# Patient Record
Sex: Female | Born: 1990 | Race: White | Hispanic: No | Smoking: Former smoker
Health system: Southern US, Community
[De-identification: ages and names within clinical notes are randomized; demographics above are authoritative.]

## PROBLEM LIST (undated history)

## (undated) DIAGNOSIS — K5792 Diverticulitis of intestine, part unspecified, without perforation or abscess without bleeding: Secondary | ICD-10-CM

## (undated) DIAGNOSIS — J45909 Unspecified asthma, uncomplicated: Secondary | ICD-10-CM

## (undated) DIAGNOSIS — K5090 Crohn's disease, unspecified, without complications: Secondary | ICD-10-CM

## (undated) DIAGNOSIS — Z832 Family history of diseases of the blood and blood-forming organs and certain disorders involving the immune mechanism: Secondary | ICD-10-CM

## (undated) DIAGNOSIS — L409 Psoriasis, unspecified: Secondary | ICD-10-CM

## (undated) HISTORY — PX: COLONOSCOPY: SHX174

## (undated) HISTORY — PX: ESOPHAGOGASTRODUODENOSCOPY: SHX1529

## (undated) HISTORY — PX: TONSILLECTOMY: SUR1361

---
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
## 2011-03-24 ENCOUNTER — Ambulatory Visit: Payer: Self-pay | Admitting: Internal Medicine

## 2011-03-28 ENCOUNTER — Ambulatory Visit: Payer: Self-pay | Admitting: Internal Medicine

## 2011-04-20 ENCOUNTER — Ambulatory Visit: Payer: Self-pay

## 2011-05-02 ENCOUNTER — Ambulatory Visit: Payer: Self-pay | Admitting: Unknown Physician Specialty

## 2011-06-18 ENCOUNTER — Ambulatory Visit: Payer: Self-pay | Admitting: Unknown Physician Specialty

## 2011-06-21 LAB — PATHOLOGY REPORT

## 2011-12-22 IMAGING — NM NUCLEAR MEDICINE HEPATOHBILIARY INCLUDE GB
2 series · 13 of 13 positions shown · non-contrast
Comparison: none

REASON FOR EXAM: RUQ abd pain
COMMENTS:

[Series 1000: gallbladder statics · 4.80mm/px · 7 of 7 slices shown]
[im 1/7]
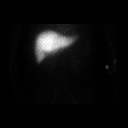
[im 2/7]
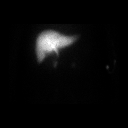
[im 3/7]
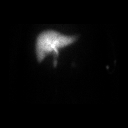
[im 4/7]
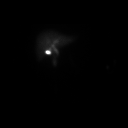
[im 5/7]
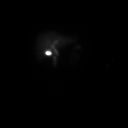
[im 6/7]
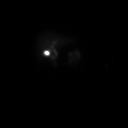
[im 7/7]
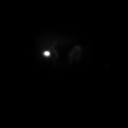

[Series 1000: gallbladder ef · 4.80mm/px · 6 of 30 frames shown]
[frame 3/30]
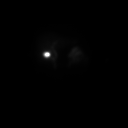
[frame 8/30]
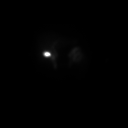
[frame 13/30]
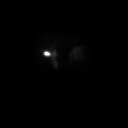
[frame 18/30]
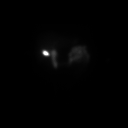
[frame 23/30]
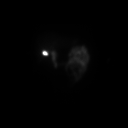
[frame 28/30]
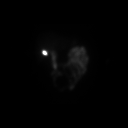

[13 of 13 positions shown; findings below may reference images not displayed]

PROCEDURE:     NM  - NM HEPATO WITH GB EJECT FRACTION  - May 02, 2011 [DATE]

RESULT:     Following intravenous administration of 8.37 mCi technetium 99m
Choletec, there is observed prompt visualization of tracer activity in the
liver at 10 minutes. At 40 minutes, tracer activity is visualized in the
gallbladder, common duct and proximal small bowel.

The gallbladder ejection fraction measures 73%.
IMPRESSION: 1.  Normal study.
2.  The gallbladder ejection fraction measures 73% which is in the normal
range.

## 2012-01-25 ENCOUNTER — Emergency Department: Payer: Self-pay | Admitting: *Deleted

## 2012-03-31 ENCOUNTER — Ambulatory Visit: Payer: Self-pay | Admitting: Emergency Medicine

## 2012-03-31 LAB — RAPID STREP-A WITH REFLX: Micro Text Report: NEGATIVE

## 2012-04-04 LAB — BETA STREP CULTURE(ARMC)

## 2012-11-17 ENCOUNTER — Emergency Department: Payer: Self-pay | Admitting: Emergency Medicine

## 2013-06-02 ENCOUNTER — Emergency Department (HOSPITAL_COMMUNITY)
Admission: EM | Admit: 2013-06-02 | Discharge: 2013-06-02 | Disposition: A | Discharge status: Home / Self Care | Payer: Managed Care, Other (non HMO) | Attending: Emergency Medicine | Admitting: Emergency Medicine

## 2013-06-02 ENCOUNTER — Encounter (HOSPITAL_COMMUNITY): Payer: Self-pay | Admitting: Emergency Medicine

## 2013-06-02 ENCOUNTER — Emergency Department (HOSPITAL_COMMUNITY): Payer: Managed Care, Other (non HMO)

## 2013-06-02 DIAGNOSIS — R42 Dizziness and giddiness: Secondary | ICD-10-CM | POA: Insufficient documentation

## 2013-06-02 DIAGNOSIS — Y9389 Activity, other specified: Secondary | ICD-10-CM | POA: Insufficient documentation

## 2013-06-02 DIAGNOSIS — Z79899 Other long term (current) drug therapy: Secondary | ICD-10-CM | POA: Insufficient documentation

## 2013-06-02 DIAGNOSIS — S0993XA Unspecified injury of face, initial encounter: Secondary | ICD-10-CM | POA: Insufficient documentation

## 2013-06-02 DIAGNOSIS — F17200 Nicotine dependence, unspecified, uncomplicated: Secondary | ICD-10-CM | POA: Insufficient documentation

## 2013-06-02 DIAGNOSIS — R209 Unspecified disturbances of skin sensation: Secondary | ICD-10-CM | POA: Insufficient documentation

## 2013-06-02 DIAGNOSIS — S0990XA Unspecified injury of head, initial encounter: Secondary | ICD-10-CM | POA: Insufficient documentation

## 2013-06-02 DIAGNOSIS — IMO0002 Reserved for concepts with insufficient information to code with codable children: Secondary | ICD-10-CM | POA: Insufficient documentation

## 2013-06-02 DIAGNOSIS — Y92410 Unspecified street and highway as the place of occurrence of the external cause: Secondary | ICD-10-CM | POA: Insufficient documentation
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
## 2013-06-02 NOTE — ED Notes (Signed)
I placed a medium C-collar on the patient.

## 2013-06-02 NOTE — ED Notes (Signed)
Pt discharged.Vital signs stable and GCS 15 
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
## 2013-06-03 NOTE — ED Provider Notes (Signed)
Medical screening examination/treatment/procedure(s) were performed by non-physician practitioner and as supervising physician I was immediately available for consultation/collaboration.  Fallan Mccarey R. Owain Eckerman, MD 06/03/13 0020 

## 2013-06-15 DIAGNOSIS — C4490 Unspecified malignant neoplasm of skin, unspecified: Secondary | ICD-10-CM | POA: Insufficient documentation

## 2013-07-13 ENCOUNTER — Ambulatory Visit: Payer: Self-pay | Admitting: Physician Assistant

## 2013-07-13 LAB — MONONUCLEOSIS SCREEN: Mono Test: NEGATIVE

## 2013-08-20 ENCOUNTER — Ambulatory Visit: Payer: Self-pay | Admitting: Otolaryngology

## 2015-03-17 ENCOUNTER — Encounter: Payer: Self-pay | Admitting: *Deleted

## 2015-03-17 ENCOUNTER — Other Ambulatory Visit: Payer: Managed Care, Other (non HMO)

## 2015-03-17 NOTE — Patient Instructions (Signed)
  Your procedure is scheduled on: 03-29-15 Report to [facility] To find out your arrival time please call [phone] between 1PM - 3PM on 03-28-15  Remember: Instructions that are not followed completely may result in serious medical risk, up to and including death, or upon the discretion of your surgeon and anesthesiologist your surgery may need to be rescheduled.    _X___ 1. Do not eat food or drink liquids after midnight. No gum chewing or hard candies.     _X___ 2. No Alcohol for 24 hours before or after surgery.   ____ 3. Bring all medications with you on the day of surgery if instructed.    ____ 4. Notify your doctor if there is any change in your medical condition     (cold, fever, infections).     Do not wear jewelry, make-up, hairpins, clips or nail polish.  Do not wear lotions, powders, or perfumes. You may wear deodorant.  Do not shave 48 hours prior to surgery. Men may shave face and neck.  Do not bring valuables to the hospital.    [facility] is not responsible for any belongings or valuables.               Contacts, dentures or bridgework may not be worn into surgery.  Leave your suitcase in the car. After surgery it may be brought to your room.  For patients admitted to the hospital, discharge time is determined by your treatment team.   Patients discharged the day of surgery will not be allowed to drive home.   Please read over the following fact sheets that you were given:      ____ Take these medicines the morning of surgery with A SIP OF WATER:    1. NONE  2.   3.   4.  5.  6.  ____ Fleet Enema (as directed)   ____ Use CHG Soap as directed  ____ Use inhalers on the day of surgery  ____ Stop metformin 2 days prior to surgery    ____ Take 1/2 of usual insulin dose the night before surgery and none on the morning of surgery.   ____ Stop Coumadin/Plavix/aspirin-N/A  ____ Stop Anti-inflammatories-NO NSAIDS OR ASA  PRODUCTS-TYLENOL OK   __X__ Stop supplements until after surgery-STOP NUTRILYTE 7 DAYS PRIOR TO SURGERY  ____ Bring C-Pap to the hospital.

## 2015-03-22 ENCOUNTER — Encounter
Admission: RE | Admit: 2015-03-22 | Discharge: 2015-03-22 | Disposition: A | Discharge status: Home / Self Care | Payer: Commercial Managed Care - PPO | Source: Ambulatory Visit | Attending: Obstetrics and Gynecology | Admitting: Obstetrics and Gynecology

## 2015-03-22 DIAGNOSIS — Z3200 Encounter for pregnancy test, result unknown: Secondary | ICD-10-CM | POA: Insufficient documentation

## 2015-03-22 DIAGNOSIS — Z01812 Encounter for preprocedural laboratory examination: Secondary | ICD-10-CM | POA: Diagnosis not present

## 2015-03-22 DIAGNOSIS — J45909 Unspecified asthma, uncomplicated: Secondary | ICD-10-CM | POA: Diagnosis not present

## 2015-03-22 DIAGNOSIS — Z79899 Other long term (current) drug therapy: Secondary | ICD-10-CM | POA: Diagnosis not present

## 2015-03-22 DIAGNOSIS — K5090 Crohn's disease, unspecified, without complications: Secondary | ICD-10-CM | POA: Diagnosis not present

## 2015-03-22 DIAGNOSIS — N8320 Unspecified ovarian cysts: Secondary | ICD-10-CM | POA: Diagnosis present

## 2015-03-22 DIAGNOSIS — F17200 Nicotine dependence, unspecified, uncomplicated: Secondary | ICD-10-CM | POA: Diagnosis not present

## 2015-03-22 DIAGNOSIS — K5792 Diverticulitis of intestine, part unspecified, without perforation or abscess without bleeding: Secondary | ICD-10-CM | POA: Diagnosis not present

## 2015-03-22 DIAGNOSIS — D270 Benign neoplasm of right ovary: Secondary | ICD-10-CM | POA: Diagnosis not present

## 2015-03-22 LAB — TYPE AND SCREEN
ABO/RH(D): O POS
ANTIBODY SCREEN: NEGATIVE

## 2015-03-23 LAB — ABO/RH: ABO/RH(D): O POS

## 2015-03-28 ENCOUNTER — Encounter: Payer: Self-pay | Admitting: *Deleted

## 2015-03-29 ENCOUNTER — Encounter
Admission: RE | Disposition: A | Discharge status: Home / Self Care | Payer: Self-pay | Source: Ambulatory Visit | Attending: Obstetrics and Gynecology

## 2015-03-29 ENCOUNTER — Ambulatory Visit
Admission: RE | Admit: 2015-03-29 | Discharge: 2015-03-29 | Disposition: A | Discharge status: Home / Self Care | Payer: Commercial Managed Care - PPO | Source: Ambulatory Visit | Attending: Obstetrics and Gynecology | Admitting: Obstetrics and Gynecology

## 2015-03-29 ENCOUNTER — Encounter: Payer: Self-pay | Admitting: *Deleted

## 2015-03-29 ENCOUNTER — Ambulatory Visit: Payer: Commercial Managed Care - PPO | Admitting: Certified Registered"

## 2015-03-29 DIAGNOSIS — D270 Benign neoplasm of right ovary: Secondary | ICD-10-CM | POA: Insufficient documentation

## 2015-03-29 DIAGNOSIS — K5090 Crohn's disease, unspecified, without complications: Secondary | ICD-10-CM | POA: Insufficient documentation

## 2015-03-29 DIAGNOSIS — J45909 Unspecified asthma, uncomplicated: Secondary | ICD-10-CM | POA: Insufficient documentation

## 2015-03-29 DIAGNOSIS — F17200 Nicotine dependence, unspecified, uncomplicated: Secondary | ICD-10-CM | POA: Insufficient documentation

## 2015-03-29 DIAGNOSIS — Z79899 Other long term (current) drug therapy: Secondary | ICD-10-CM | POA: Insufficient documentation

## 2015-03-29 HISTORY — DX: Family history of diseases of the blood and blood-forming organs and certain disorders involving the immune mechanism: Z83.2

## 2015-03-29 HISTORY — DX: Psoriasis, unspecified: L40.9

## 2015-03-29 HISTORY — PX: LAPAROSCOPIC OVARIAN CYSTECTOMY: SHX6248

## 2015-03-29 HISTORY — DX: Diverticulitis of intestine, part unspecified, without perforation or abscess without bleeding: K57.92

## 2015-03-29 HISTORY — DX: Unspecified asthma, uncomplicated: J45.909

## 2015-03-29 HISTORY — DX: Crohn's disease, unspecified, without complications: K50.90

## 2015-03-29 LAB — POCT PREGNANCY, URINE: PREG TEST UR: NEGATIVE

## 2015-03-29 SURGERY — EXCISION, CYST, OVARY, LAPAROSCOPIC
Anesthesia: General | Site: Abdomen | Wound class: Clean Contaminated

## 2015-03-29 MED ORDER — FAMOTIDINE 20 MG PO TABS
20.0000 mg | ORAL_TABLET | Freq: Once | ORAL | Status: AC
  Administered 2015-03-29: 20 mg via ORAL

## 2015-03-29 MED ORDER — ROCURONIUM BROMIDE 100 MG/10ML IV SOLN
INTRAVENOUS | Status: DC | PRN
  Administered 2015-03-29: 10 mg via INTRAVENOUS
  Administered 2015-03-29: 30 mg via INTRAVENOUS
  Administered 2015-03-29: 10 mg via INTRAVENOUS

## 2015-03-29 MED ORDER — IBUPROFEN 200 MG PO TABS: 200.0000 mg | ORAL_TABLET | Freq: Four times a day (QID) | ORAL | Status: DC | PRN

## 2015-03-29 MED ORDER — SILVER NITRATE-POT NITRATE 75-25 % EX MISC
CUTANEOUS | Status: AC
  Filled 2015-03-29: qty 2

## 2015-03-29 MED ORDER — PROPOFOL 10 MG/ML IV BOLUS
INTRAVENOUS | Status: DC | PRN
  Administered 2015-03-29: 200 mg via INTRAVENOUS

## 2015-03-29 MED ORDER — MIDAZOLAM HCL 2 MG/2ML IJ SOLN
INTRAMUSCULAR | Status: DC | PRN
  Administered 2015-03-29: 2 mg via INTRAVENOUS

## 2015-03-29 MED ORDER — DEXAMETHASONE SODIUM PHOSPHATE 4 MG/ML IJ SOLN
INTRAMUSCULAR | Status: DC | PRN
  Administered 2015-03-29: 10 mg via INTRAVENOUS

## 2015-03-29 MED ORDER — BUPIVACAINE HCL (PF) 0.5 % IJ SOLN
INTRAMUSCULAR | Status: AC
  Filled 2015-03-29: qty 30

## 2015-03-29 MED ORDER — ONDANSETRON HCL 4 MG/2ML IJ SOLN: 4.0000 mg | Freq: Once | INTRAMUSCULAR | Status: DC | PRN

## 2015-03-29 MED ORDER — FENTANYL CITRATE (PF) 100 MCG/2ML IJ SOLN
INTRAMUSCULAR | Status: AC
  Administered 2015-03-29: 25 ug via INTRAVENOUS
  Filled 2015-03-29: qty 2

## 2015-03-29 MED ORDER — OXYCODONE-ACETAMINOPHEN 5-325 MG PO TABS: 1.0000 | ORAL_TABLET | Freq: Four times a day (QID) | ORAL | Status: DC | PRN

## 2015-03-29 MED ORDER — ONDANSETRON HCL 4 MG/2ML IJ SOLN
INTRAMUSCULAR | Status: DC | PRN
  Administered 2015-03-29: 4 mg via INTRAVENOUS

## 2015-03-29 MED ORDER — FENTANYL CITRATE (PF) 100 MCG/2ML IJ SOLN
25.0000 ug | INTRAMUSCULAR | Status: AC | PRN
  Administered 2015-03-29 (×6): 25 ug via INTRAVENOUS

## 2015-03-29 MED ORDER — LIDOCAINE HCL (CARDIAC) 20 MG/ML IV SOLN
INTRAVENOUS | Status: DC | PRN
  Administered 2015-03-29: 100 mg via INTRAVENOUS

## 2015-03-29 MED ORDER — GLYCOPYRROLATE 0.2 MG/ML IJ SOLN
INTRAMUSCULAR | Status: DC | PRN
  Administered 2015-03-29: .8 mg via INTRAVENOUS

## 2015-03-29 MED ORDER — BUPIVACAINE HCL 0.5 % IJ SOLN
INTRAMUSCULAR | Status: DC | PRN
  Administered 2015-03-29: 15 mL
  Administered 2015-03-29: 25 mL

## 2015-03-29 MED ORDER — NEOSTIGMINE METHYLSULFATE 10 MG/10ML IV SOLN
INTRAVENOUS | Status: DC | PRN
  Administered 2015-03-29: 5 mg via INTRAVENOUS

## 2015-03-29 MED ORDER — LACTATED RINGERS IR SOLN
Status: DC | PRN
  Administered 2015-03-29: 500 mL

## 2015-03-29 MED ORDER — SILVER NITRATE-POT NITRATE 75-25 % EX MISC
CUTANEOUS | Status: DC | PRN
  Administered 2015-03-29: 4

## 2015-03-29 MED ORDER — DOCUSATE SODIUM 100 MG PO CAPS: 100.0000 mg | ORAL_CAPSULE | Freq: Two times a day (BID) | ORAL | Status: DC

## 2015-03-29 MED ORDER — LACTATED RINGERS IV SOLN
INTRAVENOUS | Status: DC | PRN
  Administered 2015-03-29: 16:00:00 via INTRAVENOUS

## 2015-03-29 MED ORDER — LACTATED RINGERS IV SOLN
Freq: Once | INTRAVENOUS | Status: AC
  Administered 2015-03-29: 13:00:00 via INTRAVENOUS

## 2015-03-29 MED ORDER — FAMOTIDINE 20 MG PO TABS
ORAL_TABLET | ORAL | Status: AC
  Administered 2015-03-29: 12:00:00 20 mg via ORAL
  Filled 2015-03-29: qty 1

## 2015-03-29 MED ORDER — FENTANYL CITRATE (PF) 100 MCG/2ML IJ SOLN
INTRAMUSCULAR | Status: DC | PRN
  Administered 2015-03-29 (×5): 50 ug via INTRAVENOUS

## 2015-03-29 SURGICAL SUPPLY — 50 items
BAG URO DRAIN 2000ML W/SPOUT (MISCELLANEOUS) ×3 IMPLANT
BLADE SURG SZ11 CARB STEEL (BLADE) ×3 IMPLANT
CANISTER SUCT 1200ML W/VALVE (MISCELLANEOUS) ×3 IMPLANT
CANNULA DILATOR 12 W/SLV (CANNULA) ×2 IMPLANT
CANNULA DILATOR 12MM W/SLV (CANNULA) ×1
CATH FOLEY 2WAY  5CC 16FR (CATHETERS)
CATH ROBINSON RED A/P 16FR (CATHETERS) ×3 IMPLANT
CATH URTH 16FR FL 2W BLN LF (CATHETERS) IMPLANT
CHLORAPREP W/TINT 26ML (MISCELLANEOUS) ×3 IMPLANT
CORD MONOPOLAR M/FML 12FT (MISCELLANEOUS) ×3 IMPLANT
CURITY 2 X 2 ×12 IMPLANT
DEFOGGER SCOPE WARMER CLEARIFY (MISCELLANEOUS) ×3 IMPLANT
DRESSING TELFA 4X3 1S ST N-ADH (GAUZE/BANDAGES/DRESSINGS) ×3 IMPLANT
DRSG TEGADERM 2-3/8X2-3/4 SM (GAUZE/BANDAGES/DRESSINGS) ×12 IMPLANT
ENDOPOUCH RETRIEVER 10 (MISCELLANEOUS) IMPLANT
GAUZE SPONGE NON-WVN 2X2 STRL (MISCELLANEOUS) ×2 IMPLANT
GLOVE BIO SURGEON STRL SZ7 (GLOVE) ×3 IMPLANT
GLOVE BIOGEL PI IND STRL 6.5 (GLOVE) ×1 IMPLANT
GLOVE BIOGEL PI INDICATOR 6.5 (GLOVE) ×2
GLOVE INDICATOR 7.5 STRL GRN (GLOVE) ×3 IMPLANT
GLOVE SURG NONLX 6.5 ULT (GLOVE) ×3 IMPLANT
GOWN STRL REUS W/ TWL LRG LVL3 (GOWN DISPOSABLE) ×2 IMPLANT
GOWN STRL REUS W/ TWL XL LVL3 (GOWN DISPOSABLE) ×1 IMPLANT
GOWN STRL REUS W/TWL LRG LVL3 (GOWN DISPOSABLE) ×4
GOWN STRL REUS W/TWL XL LVL3 (GOWN DISPOSABLE) ×2
GRASPER SUT TROCAR 14GX15 (MISCELLANEOUS) ×3 IMPLANT
IRRIGATION STRYKERFLOW (MISCELLANEOUS) ×1 IMPLANT
IRRIGATOR STRYKERFLOW (MISCELLANEOUS) ×3
IV LACTATED RINGERS 1000ML (IV SOLUTION) ×3 IMPLANT
KIT RM TURNOVER CYSTO AR (KITS) ×3 IMPLANT
LABEL OR SOLS (LABEL) IMPLANT
LIGASURE BLUNT 5MM 37CM (INSTRUMENTS) IMPLANT
LIQUID BAND (GAUZE/BANDAGES/DRESSINGS) ×3 IMPLANT
NDL INSUFF ACCESS 14 VERSASTEP (NEEDLE) ×3 IMPLANT
NS IRRIG 500ML POUR BTL (IV SOLUTION) ×3 IMPLANT
PACK GYN LAPAROSCOPIC (MISCELLANEOUS) ×3 IMPLANT
PAD OB MATERNITY 4.3X12.25 (PERSONAL CARE ITEMS) ×3 IMPLANT
PAD PREP 24X41 OB/GYN DISP (PERSONAL CARE ITEMS) ×3 IMPLANT
POUCH ENDO CATCH 10MM SPEC (MISCELLANEOUS) ×3 IMPLANT
SCISSORS METZENBAUM CVD 33 (INSTRUMENTS) ×3 IMPLANT
SLEEVE ENDOPATH XCEL 5M (ENDOMECHANICALS) ×6 IMPLANT
SPONGE VERSALON 2X2 STRL (MISCELLANEOUS) ×4
SUT VIC AB 0 UR5 27 (SUTURE) ×3 IMPLANT
SUT VIC AB 2-0 UR6 27 (SUTURE) IMPLANT
SUT VIC AB 4-0 FS2 27 (SUTURE) ×3 IMPLANT
SUT VIC AB 4-0 PS2 18 (SUTURE) IMPLANT
SYRINGE 10CC LL (SYRINGE) ×3 IMPLANT
TROCAR BLUNT TIP 12MM OMST12BT (TROCAR) ×3 IMPLANT
TROCAR XCEL UNIV SLVE 11M 100M (ENDOMECHANICALS) IMPLANT
TUBING INSUFFLATOR HI FLOW (MISCELLANEOUS) ×3 IMPLANT

## 2015-03-29 NOTE — H&P (Signed)
GYN H&P Reviewed from office and no changes. Can proceed with laparoscopic right ovarian cystectomy when OR is ready.  Durene Romans MD [facility]  Pager: [phone]

## 2015-03-29 NOTE — Anesthesia Procedure Notes (Signed)
Procedure Name: Intubation Date/Time: 03/29/2015 3:47 PM Performed by: Silvana Newness Pre-anesthesia Checklist: Patient identified, Emergency Drugs available, Suction available, Patient being monitored and Timeout performed Patient Re-evaluated:Patient Re-evaluated prior to inductionOxygen Delivery Method: Circle system utilized Preoxygenation: Pre-oxygenation with 100% oxygen Intubation Type: IV induction Ventilation: Mask ventilation without difficulty Laryngoscope Size: Mac and 3 Grade View: Grade I Tube size: 7.0 mm Number of attempts: 1 Airway Equipment and Method: Rigid stylet Placement Confirmation: ETT inserted through vocal cords under direct vision,  positive ETCO2 and breath sounds checked- equal and bilateral Secured at: 21 cm Tube secured with: Tape Dental Injury: Teeth and Oropharynx as per pre-operative assessment

## 2015-03-29 NOTE — Discharge Instructions (Addendum)
[facility] Laparoscopic Surgery Discharge Instructions  Instructions Following Laparoscopic Surgery You have just undergone a laparoscopic surgery.  The following list should answer your most common questions.  Although we will discuss your surgery and post-operative instructions with you prior to your discharge, this list will serve as a reminder if you fail to recall the details of what we discussed.  We will discuss your surgery once again in detail at your post-op visit in two to four weeks. If you havent already done so, please call to make your appointment as soon as possible.  How you will feel: Although you have just undergone a major surgery, your recovery will be significantly shorter since the surgery was performed through much smaller incisions than the traditional approach.  You should feel slightly better each day.  If you suddenly feel much worse than the prior day, please call the clinic.  Its important during the early part of your recovery that you maintain some activity.  Walking is encouraged.  You will quicken your recovery by continued activity.  Incision:  Your incisions will be closed with dissolvable stitches or surgical adhesive (glue).  There may be Band-aids and/or Steri-strips covering your incisions.  If there is no drainage from the incisions you may remove the Band-aids in one to two days.  You may notice some minor bruising at the incision sites.  This is common and will resolve within several days.  Please inform us if the redness at the edges of your incision appears to be spreading.  If the skin around your incision becomes warm to the touch, or if you notice a pus-like drainage, please call the office.  Stairs/Driving/Activities: You may climb stairs if necessary.  If youve had general anesthesia, do not drive a car the rest of the day today.  You may begin light housework when you feel up to it, but avoid heavy lifting (more than 20lbs) or pushing until  cleared for these activities by your physician.  Hygiene:  Do not soak your incisions.  Showers are acceptable but you may not take a bath or swim in a pool.  Cleanse your incisions daily with soap and water.  Medications:  Please resume taking any medications that you were taking prior to the surgery.  If we have prescribed any new medications for you, please take them as directed.  Constipation:  It is fairly common to experience some difficulty in moving your bowels following major surgery.  Being active will help to reduce this likelihood. A diet rich in fiber and plenty of liquids is desirable.  If you do become constipated, a mild laxative such as Miralax, Milk of Magnesia, or Metamucil, or a stool softener such as Colace, is recommended.  General Instructions: If you develop a fever of 100.5 degrees or higher, please call the office number(s) below for physician on call.    We will discuss your surgery once again in detail at your post-op visit in two to four weeks. If you havent already done so, please call to make your appointment as soon as possible.  [facility] [place]  [address] [place], [place] [address] [place], [place] [address]  Phone: [phone] Phone: [phone]  Fax: [phone] Fax: [phone]       AMBULATORY SURGERY  DISCHARGE INSTRUCTIONS   1) The drugs that you were given will stay in your system until tomorrow so for the next 24 hours you should not:  A) Drive an automobile B) Make  any legal decisions C) Drink any alcoholic beverage   2) You may resume regular meals tomorrow.  Today it is better to start with liquids and gradually work up to solid foods.  You may eat anything you prefer, but it is better to start with liquids, then soup and crackers, and gradually work up to solid foods.   3) Please notify your doctor immediately if you have any unusual bleeding, trouble breathing, redness and pain at the surgery site,  drainage, fever, or pain not relieved by medication.    4) Additional Instructions:        Please contact your physician with any problems or Same Day Surgery at [phone], Monday through Friday 6 am to 4 pm, or [place] at [facility] number at [phone].AMBULATORY SURGERY  DISCHARGE INSTRUCTIONS   5) The drugs that you were given will stay in your system until tomorrow so for the next 24 hours you should not:  D) Drive an automobile E) Make any legal decisions F) Drink any alcoholic beverage   6) You may resume regular meals tomorrow.  Today it is better to start with liquids and gradually work up to solid foods.  You may eat anything you prefer, but it is better to start with liquids, then soup and crackers, and gradually work up to solid foods.   7) Please notify your doctor immediately if you have any unusual bleeding, trouble breathing, redness and pain at the surgery site, drainage, fever, or pain not relieved by medication.    8) Additional Instructions:        Please contact your physician with any problems or Same Day Surgery at [phone], Monday through Friday 6 am to 4 pm, or [place] at [facility] number at [phone].\[phone]\

## 2015-03-29 NOTE — Anesthesia Preprocedure Evaluation (Signed)
Anesthesia Evaluation  Patient identified by MRN, date of birth, ID band Patient awake    Reviewed: Allergy & Precautions, NPO status , Patient's Chart, lab work & pertinent test results, reviewed documented beta blocker date and time   Airway Mallampati: II  TM Distance: >3 FB     Dental  (+) Chipped   Pulmonary asthma , Current Smoker,          Cardiovascular     Neuro/Psych    GI/Hepatic   Endo/Other    Renal/GU      Musculoskeletal   Abdominal   Peds  Hematology   Anesthesia Other Findings   Reproductive/Obstetrics                             Anesthesia Physical Anesthesia Plan  ASA: II  Anesthesia Plan: General   Post-op Pain Management:    Induction: Intravenous  Airway Management Planned: Oral ETT  Additional Equipment:   Intra-op Plan:   Post-operative Plan:   Informed Consent: I have reviewed the patients History and Physical, chart, labs and discussed the procedure including the risks, benefits and alternatives for the proposed anesthesia with the patient or authorized representative who has indicated his/her understanding and acceptance.     Plan Discussed with: CRNA  Anesthesia Plan Comments:         Anesthesia Quick Evaluation

## 2015-03-29 NOTE — Discharge Summary (Addendum)
Gynecology Discharge Summary Date of Admission: 03/29/2015 Date of Discharge: 03/29/2015  The patient was admitted, as scheduled, and underwent a laparoscopic right ovarian cystectomy; please refer to operative note for full details.  She was meeting all post op goals and discharged to home from the PACU    Medication List    TAKE these medications        docusate sodium 100 MG capsule  Commonly known as:  COLACE  Take 1 capsule (100 mg total) by mouth 2 (two) times daily.     guaiFENesin 600 MG 12 hr tablet  Commonly known as:  MUCINEX  Take 1,200 mg by mouth 2 (two) times daily.     hyoscyamine 0.125 MG tablet  Commonly known as:  LEVSIN, ANASPAZ  Take 0.125 mg by mouth every 8 (eight) hours as needed for cramping.     ibuprofen 200 MG tablet  Commonly known as:  MOTRIN IB  Take 1 tablet (200 mg total) by mouth every 6 (six) hours as needed.     loratadine 10 MG tablet  Commonly known as:  CLARITIN  Take 10 mg by mouth daily.     norethindrone-ethinyl estradiol 0.5-35 MG-MCG tablet  Commonly known as:  NECON,BREVICON,MODICON  Take 1 tablet by mouth daily.     OTEZLA 10 & 20 & 30 MG Tbpk  Generic drug:  Apremilast  Take 1 tablet by mouth daily.     oxyCODONE-acetaminophen 5-325 MG per tablet  Commonly known as:  ROXICET #8  Take 1 tablet by mouth every 6 (six) hours as needed for severe pain.     UNABLE TO FIND  Take 1 tablet by mouth daily. Med Name: NUTRILYTE        She will follow up with Dr. Ilda Basset in 2-4 weeks  Durene Romans MD [facility]  Pager: [phone]

## 2015-03-29 NOTE — Op Note (Signed)
Operative Note   03/29/2015  PRE-OP DIAGNOSIS: 8cm persistent right ovarian cyst. Abdominal pain and pressure. BMI 35   POST-OP DIAGNOSIS: Same   SURGEON: Surgeon(s) and Role:    * Aletha Halim, MD - Primary  ASSISTANT:  Gae Dry, MD - Assisting  ANESTHESIA: General and local  PROCEDURE: LAPAROSCOPIC RIGHT OVARIAN CYSTECTOMY   ESTIMATED BLOOD LOSS: 4mL  DRAINS: indwelling foley 139mL UOP   TOTAL IV FLUIDS: 864mL crystalloid  SPECIMENS: right ovarian cyst wall to pathology  VTE PROPHYLAXIS: SCDs to the bilateral lower extremities  ANTIBIOTICS: none indicated  COMPLICATIONS: none  DISPOSITION: PACU - hemodynamically stable.  CONDITION: stable  FINDINGS: Exam under anesthesia was negative and patient sounded to approximately 8cm. On diagnostic laparoscopy, grossly normal uterus, left fallopian tube, ovary, liver and stomach edge and no intra abdominal adhesions were noted.  In the right adnexa, an approximately 8cm right ovary simple cyst was noted that was distinct from the small right, normal appearing ovary. Approximately, 39mL of clear, benign appearing fluid was noted coming from the cyst, upon accidental rupture during the dissection and removal.  The right fallopian tube appeared grossly normal, although elongated from the cyst size.   PROCEDURE IN DETAIL: The patient was taken to the OR where anesthesia was administed. The patient was positioned in dorsal lithotomy in the [place]. The patient was then examined under anesthesia with the above noted findings. The patient was prepped and draped in the normal sterile fashion and foley catheter was placed. A Graves speculum was placed in the vagina and the anterior lip of the cervix was grasped with a single toothed tenaculum.  A Hulka uterine manipulator was then inserted in the uterus and uterine mobility was found to be satisfactory; the speculum was then removed.  After changing gloves, attention was turned  to the patient's abdomen where a 10 mm skin incision was made in the umbilical fold, after injection of local anesthesia. The Veress step needle was carefully introduced into the peritoneal cavity at a 45 degree angle while tenting up the anterior abdominal wall. Intraperitoneal placement was confirmed by the use of a water-filled syringe and with the opening pressure was 5 mmHg. Pneumoperitoneum was obtained. The 44mm port was then placed through the sleeve and the operative laparoscope was introduced into the abdomen with the above noted findings.  Next the area below was inspected and no injury was noted and the patient was placed in trendelenburg and the 79mm right and left lower quadrants were placed, as well as a 23mm suprapubic port was placed. The cyst was noted to be stretching the meso salpingx and was distinct from the ovary and the cyst was gradually shelled out from the meso salpingx. During this, the cyst was accidentally ruptured and care was to avoid trauma to the fallopian tube.  The cyst was then removed and placed in the lower abdomen and the area of dissection was inspected and several small areas were coagulated with the monopolar scissors and maryland dissectors.  The endocatch was then used to remove cyst, which was easily done. The umbilical port was then put back in and the operative area was then viewed under low pressure of 65mmHg and was noted to be hemostatic.  All ports were then removed under direct visualization and the air was released prior to removal of the umbilical port.  The fascia there was then closed with a figure of eight suture of 0 vicryl and skin closed with 4-0 monocryl in a  subcuticular fashion. Next the other skin incision were closed with Dermabond. The Hulka was removed with no bleeding noted from the cervix and all other instrumentation was removed from the vagina.  The foley catheter was removed. The patient tolerated the procedure well. All counts were correct x 2.  The patient was transferred to the recovery room awake, alert and breathing independently.   Durene Romans MD [facility]  Pager: [phone]

## 2015-03-29 NOTE — Transfer of Care (Signed)
Immediate Anesthesia Transfer of Care Note  Patient: Angela Hebert  Procedure(s) Performed: Procedure(s): LAPAROSCOPIC RIGHT OVARIAN CYSTECTOMY (N/A)  Patient Location: PACU  Anesthesia Type:General  Level of Consciousness: sedated  Airway & Oxygen Therapy: Patient Spontanous Breathing and Patient connected to face mask oxygen  Post-op Assessment: Report given to RN and Post -op Vital signs reviewed and stable  Post vital signs: Reviewed and stable  Last Vitals:  Filed Vitals:   03/29/15 1738  BP: 132/71  Pulse: 82  Temp: 37 C  Resp: 9    Complications: No apparent anesthesia complications

## 2015-03-29 NOTE — Anesthesia Postprocedure Evaluation (Signed)
  Anesthesia Post-op Note  Patient: Angela Hebert  Procedure(s) Performed: Procedure(s): LAPAROSCOPIC RIGHT OVARIAN CYSTECTOMY (N/A)  Anesthesia type:General  Patient location: PACU  Post pain: Pain level controlled  Post assessment: Post-op Vital signs reviewed, Patient's Cardiovascular Status Stable, Respiratory Function Stable, Patent Airway and No signs of Nausea or vomiting  Post vital signs: Reviewed and stable  Last Vitals:  Filed Vitals:   03/29/15 1809  BP: 110/84  Pulse: 79  Temp: 36.7 C  Resp: 16    Level of consciousness: awake, alert  and patient cooperative  Complications: No apparent anesthesia complications

## 2015-03-30 ENCOUNTER — Encounter: Payer: Self-pay | Admitting: Obstetrics and Gynecology

## 2015-03-31 LAB — SURGICAL PATHOLOGY

## 2017-03-12 ENCOUNTER — Other Ambulatory Visit: Payer: Self-pay | Admitting: Obstetrics and Gynecology

## 2017-03-14 ENCOUNTER — Telehealth: Payer: Self-pay | Admitting: Obstetrics and Gynecology

## 2017-03-15 ENCOUNTER — Other Ambulatory Visit: Payer: Self-pay

## 2017-03-15 MED ORDER — NORETHINDRONE-ETH ESTRADIOL 0.5-35 MG-MCG PO TABS: 1.0000 | ORAL_TABLET | Freq: Every day | ORAL | 3 refills | Status: DC

## 2017-03-15 NOTE — Telephone Encounter (Signed)
Patient needs refill on bc.  She has moved to [place] and will be establishing care there.  She is asking for a refill until she can get in with a new provider.  Asking if this could be sent to [facility] on [address], [place], [place].

## 2017-03-17 ENCOUNTER — Other Ambulatory Visit: Payer: Self-pay | Admitting: Obstetrics and Gynecology

## 2017-11-17 DIAGNOSIS — O479 False labor, unspecified: Secondary | ICD-10-CM | POA: Insufficient documentation

## 2017-11-17 DIAGNOSIS — Z3A39 39 weeks gestation of pregnancy: Secondary | ICD-10-CM | POA: Insufficient documentation

## 2019-09-11 NOTE — L&D Delivery Note (Signed)
Delivery Note At 1:43 PM a viable female was delivered via Vaginal, Spontaneous (Presentation: Left Occiput Anterior).  APGAR: 8, 9; weight pending.   Placenta status: Spontaneous, Intact. Cord: 3 vessels with the following complications: None.  Cord pH: n/a  Anesthesia: Epidural Episiotomy: None Lacerations: 1st degree;Vaginal;Perineal Suture Repair: 3.0 vicryl Est. Blood Loss (mL):  300, Quantitative Blood loss pending.  Mom to postpartum.  Baby to Couplet care / Skin to Skin.  Called to see patient.  Mom pushed to deliver a viable female infant.  The head followed by shoulders, which delivered without difficulty, and the rest of the body.  No nuchal cord noted. A single, tight body cord noted and delivered through, then reduced.  Baby to mom's chest.  Cord clamped and cut after > 1 min delay.  Cord blood obtained.  Placenta delivered spontaneously, intact, with a 3-vessel cord.  First degree perineal and vaginal laceration repaired with 3-0 Vicryl in standard fashion.  All counts correct.  Hemostasis obtained with IV pitocin and fundal massage. EBL 300 mL.     Prentice Docker, MD 06/07/2020, 2:14 PM

## 2019-11-19 ENCOUNTER — Other Ambulatory Visit (HOSPITAL_COMMUNITY)
Admission: RE | Admit: 2019-11-19 | Discharge: 2019-11-19 | Disposition: A | Discharge status: Home / Self Care | Payer: Medicaid Other | Source: Ambulatory Visit | Attending: Obstetrics and Gynecology | Admitting: Obstetrics and Gynecology

## 2019-11-19 ENCOUNTER — Other Ambulatory Visit: Payer: Self-pay

## 2019-11-19 ENCOUNTER — Ambulatory Visit (INDEPENDENT_AMBULATORY_CARE_PROVIDER_SITE_OTHER): Payer: Medicaid Other | Admitting: Obstetrics and Gynecology

## 2019-11-19 ENCOUNTER — Encounter: Payer: Self-pay | Admitting: Obstetrics and Gynecology

## 2019-11-19 VITALS — BP 128/69 | Wt 210.0 lb

## 2019-11-19 DIAGNOSIS — Z113 Encounter for screening for infections with a predominantly sexual mode of transmission: Secondary | ICD-10-CM

## 2019-11-19 DIAGNOSIS — Z3480 Encounter for supervision of other normal pregnancy, unspecified trimester: Secondary | ICD-10-CM | POA: Diagnosis present

## 2019-11-19 DIAGNOSIS — Z124 Encounter for screening for malignant neoplasm of cervix: Secondary | ICD-10-CM | POA: Insufficient documentation

## 2019-11-19 DIAGNOSIS — Z3A12 12 weeks gestation of pregnancy: Secondary | ICD-10-CM

## 2019-11-19 DIAGNOSIS — Z3481 Encounter for supervision of other normal pregnancy, first trimester: Secondary | ICD-10-CM

## 2019-11-19 NOTE — Progress Notes (Signed)
New Obstetric Patient H&P   Chief Complaint: "Desires prenatal care"   History of Present Illness: Patient is a 29 y.o. G2P1001 Not Hispanic or Latino female, uncertain LMP [id number] presents with amenorrhea and positive home pregnancy test. Based on her  LMP, her EDD is Estimated Date of Delivery: 06/05/20 and her EGA is [redacted]w[redacted]d. Cycles are regular, occurring every 28 days, lasting about 6-7 days.  Her last pap smear was 2 years ago and was abnormal, she states it was abnormal.    She had a urine pregnancy test which was positive 3 week(s)  ago. She was taking oral contraception when she conceived.  Since her LMP she claims she has experienced no issues. She denies vaginal bleeding. Her past medical history is notable for Chron disease, family history of hemophilia, . Her prior pregnancies are notable for no issues.  Since her LMP, she admits to the use of tobacco products  Yes (but has quit now) She claims she has lost 15 pounds since the start of her pregnancy.  There are cats in the home in the home  no  She admits close contact with children on a regular basis  yes  She has had chicken pox in the past no She has had Tuberculosis exposures, symptoms, or previously tested positive for TB   no Current or past history of domestic violence. no  Genetic Screening/Teratology Counseling: (Includes patient, baby's father, or anyone in either family with:)   84. Patient's age >/= 30 [facility]  no 2. Thalassemia ([place], [place], [place], or Asian background): MCV<80  no 3. Neural tube defect (meningomyelocele, spina bifida, anencephaly)  no 4. Congenital heart defect  no  5. Down syndrome  no 6. Tay-Sachs (Jewish, [place])  no 7. Canavan's Disease  no 8. Sickle cell disease or trait (African)  no  9. Hemophilia or other blood disorders  Yes, MGM, maternal uncles  45. Muscular dystrophy  no  11. Cystic fibrosis  no  12. Huntington's Chorea  no  13. Mental retardation/autism   no 14. Other inherited genetic or chromosomal disorder  no 15. Maternal metabolic disorder (DM, PKU, etc)  no 16. Patient or FOB with a child with a birth defect not listed above no  16a. Patient or FOB with a birth defect themselves no 17. Recurrent pregnancy loss, or stillbirth  no  18. Any medications since LMP other than prenatal vitamins (include vitamins, supplements, OTC meds, drugs, alcohol)  no 19. Any other genetic/environmental exposure to discuss: no  Infection History:   1. Lives with someone with TB or TB exposed  no  2. Patient or partner has history of genital herpes  no 3. Rash or viral illness since LMP  no 4. History of STI (GC, CT, HPV, syphilis, HIV)  no 5. History of recent travel :  no  Other pertinent information:  no   Review of Systems:10 point review of systems negative unless otherwise noted in HPI  Past Medical History:  Diagnosis Date  . Asthma   . Crohn disease ([place])   . Diverticulitis   . Family history of hemophilia    PTS MATERNAL GRANDMOTHER, ALL OTHER WOMEN IN FAMILY ARE CARRIERS  . Psoriasis     Past Surgical History:  Procedure Laterality Date  . COLONOSCOPY    . ESOPHAGOGASTRODUODENOSCOPY    . LAPAROSCOPIC OVARIAN CYSTECTOMY N/A 03/29/2015   Procedure: LAPAROSCOPIC RIGHT OVARIAN CYSTECTOMY;  Surgeon: Aletha Halim, MD;  Location: [facility] ORS;  Service: Gynecology;  Laterality: N/A;  .  TONSILLECTOMY      Gynecologic History: Patient's last menstrual period was 08/30/2019 (exact date).  Obstetric History: G2P1001  Family History  Problem Relation Age of Onset  . Hemophilia Maternal Uncle        two maternal uncles    Social History   Socioeconomic History  . Marital status: Single    Spouse name: Not on file  . Number of children: Not on file  . Years of education: Not on file  . Highest education level: Not on file  Occupational History  . Not on file  Tobacco Use  . Smoking status: Former Smoker    Packs/day: 0.25     Years: 4.00    Pack years: 1.00    Types: Cigarettes  . Smokeless tobacco: Never Used  . Tobacco comment: Quit with 2nd pregnancy  Substance and Sexual Activity  . Alcohol use: No  . Drug use: No  . Sexual activity: Yes    Birth control/protection: None  Other Topics Concern  . Not on file  Social History Narrative  . Not on file   Social Determinants of Health   Financial Resource Strain:   . Difficulty of Paying Living Expenses:   Food Insecurity:   . Worried About Charity fundraiser in the Last Year:   . Arboriculturist in the Last Year:   Transportation Needs:   . Film/video editor (Medical):   Marland Kitchen Lack of Transportation (Non-Medical):   Physical Activity:   . Days of Exercise per Week:   . Minutes of Exercise per Session:   Stress:   . Feeling of Stress :   Social Connections:   . Frequency of Communication with Friends and Family:   . Frequency of Social Gatherings with Friends and Family:   . Attends Religious Services:   . Active Member of Clubs or Organizations:   . Attends Archivist Meetings:   Marland Kitchen Marital Status:   Intimate Partner Violence:   . Fear of Current or Ex-Partner:   . Emotionally Abused:   Marland Kitchen Physically Abused:   . Sexually Abused:     Allergies  Allergen Reactions  . Latex Rash    Prior to Admission medications: PNV    Physical Exam BP 128/69   Wt 210 lb (95.3 kg)   LMP 08/30/2019 (Exact Date)   BMI 31.93 kg/m   Physical Exam Constitutional:      General: She is not in acute distress.    Appearance: Normal appearance.  HENT:     Head: Normocephalic and atraumatic.  Eyes:     General: No scleral icterus.    Conjunctiva/sclera: Conjunctivae normal.  Cardiovascular:     Rate and Rhythm: Normal rate and regular rhythm.     Heart sounds: No murmur. No friction rub. No gallop.   Pulmonary:     Effort: Pulmonary effort is normal. No respiratory distress.     Breath sounds: Normal breath sounds.  Abdominal:      General: There is no distension.     Palpations: Abdomen is soft. There is no mass.     Tenderness: There is no abdominal tenderness. There is no guarding or rebound.  Musculoskeletal:        General: No swelling or tenderness. Normal range of motion.     Cervical back: Normal range of motion and neck supple.  Skin:    General: Skin is warm and dry.     Coloration: Skin is not jaundiced.  Neurological:     General: No focal deficit present.     Mental Status: She is alert and oriented to person, place, and time.     Cranial Nerves: No cranial nerve deficit.  Psychiatric:        Mood and Affect: Mood normal.        Behavior: Behavior normal.        Judgment: Judgment normal.     BSUS: SLIUP at ~[redacted]w[redacted]d Female Chaperone present during breast and/or pelvic exam.   Assessment: 29 y.o. G2P1001 at [redacted]w[redacted]d presenting to initiate prenatal care  Plan: 1) Avoid alcoholic beverages. 2) Patient encouraged not to smoke.  3) Discontinue the use of all non-medicinal drugs and chemicals.  4) Take prenatal vitamins daily.  5) Nutrition, food safety (fish, cheese advisories, and high nitrite foods) and exercise discussed. 6) Hospital and practice style discussed with cross coverage system.  7) Genetic Screening, such as with 1st Trimester Screening, cell free fetal DNA, AFP testing, and Ultrasound, as well as with amniocentesis and CVS as appropriate, is discussed with patient. At the conclusion of today's visit patient requested genetic testing   Prentice Docker, MD 11/19/2019 2:46 PM

## 2019-11-20 LAB — RPR+RH+ABO+RUB AB+AB SCR+CB...
Antibody Screen: NEGATIVE
HIV Screen 4th Generation wRfx: NONREACTIVE
Hematocrit: 40.8 % (ref 34.0–46.6)
Hemoglobin: 14.5 g/dL (ref 11.1–15.9)
Hepatitis B Surface Ag: NEGATIVE
MCH: 33 pg (ref 26.6–33.0)
MCHC: 35.5 g/dL (ref 31.5–35.7)
MCV: 93 fL (ref 79–97)
Platelets: 246 10*3/uL (ref 150–450)
RBC: 4.4 x10E6/uL (ref 3.77–5.28)
RDW: 12.4 % (ref 11.7–15.4)
RPR Ser Ql: NONREACTIVE
Rh Factor: POSITIVE
Rubella Antibodies, IGG: 5.52 index (ref >0.99)
Varicella zoster IgG: <135 index — ABNORMAL LOW (ref >165)
WBC: 8.8 10*3/uL (ref 3.4–10.8)

## 2019-11-21 LAB — URINE DRUG PANEL 7
Amphetamines, Urine: NEGATIVE ng/mL
Barbiturate Quant, Ur: NEGATIVE ng/mL
Benzodiazepine Quant, Ur: NEGATIVE ng/mL
Cannabinoid Quant, Ur: NEGATIVE ng/mL
Cocaine (Metab.): NEGATIVE ng/mL
Opiate Quant, Ur: NEGATIVE ng/mL
PCP Quant, Ur: NEGATIVE ng/mL

## 2019-11-21 LAB — URINE CULTURE: Organism ID, Bacteria: NO GROWTH

## 2019-11-23 ENCOUNTER — Ambulatory Visit (INDEPENDENT_AMBULATORY_CARE_PROVIDER_SITE_OTHER): Payer: Medicaid Other

## 2019-11-23 ENCOUNTER — Encounter: Payer: Self-pay | Admitting: Certified Nurse Midwife

## 2019-11-23 ENCOUNTER — Other Ambulatory Visit: Payer: Medicaid Other

## 2019-11-23 ENCOUNTER — Other Ambulatory Visit: Payer: Self-pay

## 2019-11-23 ENCOUNTER — Telehealth: Payer: Self-pay | Admitting: Obstetrics and Gynecology

## 2019-11-23 ENCOUNTER — Ambulatory Visit (INDEPENDENT_AMBULATORY_CARE_PROVIDER_SITE_OTHER): Payer: Medicaid Other | Admitting: Certified Nurse Midwife

## 2019-11-23 ENCOUNTER — Other Ambulatory Visit: Payer: Self-pay | Admitting: Obstetrics and Gynecology

## 2019-11-23 VITALS — BP 100/60 | Wt 208.0 lb

## 2019-11-23 DIAGNOSIS — Z3A12 12 weeks gestation of pregnancy: Secondary | ICD-10-CM

## 2019-11-23 DIAGNOSIS — Z3480 Encounter for supervision of other normal pregnancy, unspecified trimester: Secondary | ICD-10-CM

## 2019-11-23 DIAGNOSIS — A749 Chlamydial infection, unspecified: Secondary | ICD-10-CM | POA: Insufficient documentation

## 2019-11-23 DIAGNOSIS — Z3689 Encounter for other specified antenatal screening: Secondary | ICD-10-CM

## 2019-11-23 DIAGNOSIS — Z1379 Encounter for other screening for genetic and chromosomal anomalies: Secondary | ICD-10-CM

## 2019-11-23 LAB — POCT URINALYSIS DIPSTICK OB
Glucose, UA: NEGATIVE
POC,PROTEIN,UA: NEGATIVE

## 2019-11-23 LAB — CYTOLOGY - PAP
Chlamydia: POSITIVE — AB
Comment: NEGATIVE
Comment: NORMAL
Diagnosis: NEGATIVE
Neisseria Gonorrhea: NEGATIVE

## 2019-11-23 MED ORDER — AZITHROMYCIN 500 MG PO TABS: 1000.0000 mg | ORAL_TABLET | Freq: Once | ORAL | 0 refills | Status: AC

## 2019-11-23 NOTE — Telephone Encounter (Signed)
Discussed finding of chlamydia. Will treat with azithromycin. Discussed need to have partner treated and abstinence until both are treated. Will obtain a test of cure in about 4 weeks. All questions answered.

## 2019-11-23 NOTE — Progress Notes (Signed)
No concerns, rj 

## 2019-11-23 NOTE — Progress Notes (Signed)
ROB at 12wk1d: Had dating scan (conceived on pills) and 1 hour GTT today. Feeling well CRL 12wk6d with EDC=05/31/20. Will change EDC since patient conceived on pills. FCA 153 on ultrasound and with DT NOB blood work reviewed. Encouraged vaccine for chickenpox postpartum [id number] today 1 hour GTT done  ROB  In 4 weeks Will call with results of MaterniT 21. Dalia Heading, CNM

## 2019-11-24 LAB — GLUCOSE, 1 HOUR GESTATIONAL: Gestational Diabetes Screen: 74 mg/dL (ref 65–139)

## 2019-11-25 NOTE — Telephone Encounter (Signed)
Pt left msg on triage line asking to speak to SDJ in regards to last visits results. Called pt back and she is aware you are out of the office today and will respond when you are back at your earliest convenience. She has more qs to the previous chat-chlamydia.

## 2019-11-28 LAB — MATERNIT21 PLUS CORE+SCA
Fetal Fraction: 12
Monosomy X (Turner Syndrome): NOT DETECTED
Result (T21): NEGATIVE
Trisomy 13 (Patau syndrome): NEGATIVE
Trisomy 18 (Edwards syndrome): NEGATIVE
Trisomy 21 (Down syndrome): NEGATIVE
XXX (Triple X Syndrome): NOT DETECTED
XXY (Klinefelter Syndrome): NOT DETECTED
XYY (Jacobs Syndrome): NOT DETECTED

## 2019-12-24 ENCOUNTER — Other Ambulatory Visit (HOSPITAL_COMMUNITY)
Admission: RE | Admit: 2019-12-24 | Discharge: 2019-12-24 | Disposition: A | Discharge status: Home / Self Care | Payer: Medicaid Other | Source: Ambulatory Visit | Attending: Obstetrics and Gynecology | Admitting: Obstetrics and Gynecology

## 2019-12-24 ENCOUNTER — Encounter: Payer: Self-pay | Admitting: Obstetrics and Gynecology

## 2019-12-24 ENCOUNTER — Ambulatory Visit (INDEPENDENT_AMBULATORY_CARE_PROVIDER_SITE_OTHER): Payer: Medicaid Other | Admitting: Obstetrics and Gynecology

## 2019-12-24 ENCOUNTER — Other Ambulatory Visit: Payer: Self-pay

## 2019-12-24 VITALS — BP 106/65 | Wt 210.0 lb

## 2019-12-24 DIAGNOSIS — A749 Chlamydial infection, unspecified: Secondary | ICD-10-CM | POA: Insufficient documentation

## 2019-12-24 DIAGNOSIS — O98811 Other maternal infectious and parasitic diseases complicating pregnancy, first trimester: Secondary | ICD-10-CM | POA: Insufficient documentation

## 2019-12-24 DIAGNOSIS — Z3A17 17 weeks gestation of pregnancy: Secondary | ICD-10-CM

## 2019-12-24 DIAGNOSIS — Z3482 Encounter for supervision of other normal pregnancy, second trimester: Secondary | ICD-10-CM | POA: Diagnosis present

## 2019-12-24 NOTE — Progress Notes (Signed)
Routine Prenatal Care Visit  Subjective  Angela Hebert is a 29 y.o. G2P1001 at [redacted]w[redacted]d being seen today for ongoing prenatal care.  She is currently monitored for the following issues for this low-risk pregnancy and has Supervision of other normal pregnancy, antepartum and Chlamydia infection affecting pregnancy on their problem list.  ----------------------------------------------------------------------------------- Patient reports sharp pains in her lower abdomen.  Seems to be associated with movement. Denies f/c/n/v/d/constipation. She denies dysuria, hematuria. She denies hematochezia. She denies abnormal vaginal discharge and abnormal odor.  No vaginal irritative symptoms.     . Vag. Bleeding: None.   . Leaking Fluid denies.  ----------------------------------------------------------------------------------- The following portions of the patient's history were reviewed and updated as appropriate: allergies, current medications, past family history, past medical history, past social history, past surgical history and problem list. Problem list updated.  Objective  Blood pressure 106/65, weight 210 lb (95.3 kg), last menstrual period 08/30/2019. Pregravid weight 220 lb (99.8 kg) Total Weight Gain -10 lb (-4.536 kg) Urinalysis: Urine Protein    Urine Glucose    Fetal Status: Fetal Heart Rate (bpm): 145         General:  Alert, oriented and cooperative. Patient is in no acute distress.  Skin: Skin is warm and dry. No rash noted.   Cardiovascular: Normal heart rate noted  Respiratory: Normal respiratory effort, no problems with respiration noted  Abdomen: Soft, gravid, appropriate for gestational age. Pain/Pressure: Present     Pelvic:  Cervical exam deferred        Extremities: Normal range of motion.     Mental Status: Normal mood and affect. Normal behavior. Normal judgment and thought content.   Assessment   29 y.o. G2P1001 at [redacted]w[redacted]d by  05/31/2020, by Ultrasound presenting for  routine prenatal visit  Plan   Pregnancy#2 Problems (from 08/30/19 to present)    Problem Noted Resolved   Chlamydia infection affecting pregnancy 11/23/2019 by Will Bonnet, MD No   Supervision of other normal pregnancy, antepartum 11/19/2019 by Will Bonnet, MD No   Overview Addendum 12/01/2019  1:24 PM by Dalia Heading, [place] Prenatal Labs  Dating 12wk6d [place] Blood type: O/Positive/-- (03/11 1549)   Genetic Screen 1 Screen:    AFP:     Quad:     [place] XX Antibody:Negative (03/11 1549)  Anatomic [place]  Rubella: 5.52 (03/11 1549) Varicella:NI  GTT Early:  74             Third trimester:  RPR: Non Reactive (03/11 1549)   Rhogam  HBsAg: Negative (03/11 1549)   TDaP vaccine                       Flu Shot: HIV: Non Reactive (03/11 1549)   Baby Food                                GBS:   Contraception  Pap:  CBB   Chlamydia positive 3/11  CS/VBAC    Support Person                Preterm labor symptoms and general obstetric precautions including but not limited to vaginal bleeding, contractions, leaking of fluid and fetal movement were reviewed in detail with the patient. Please refer to After Visit Summary for other counseling recommendations.   - TOC for chlamydia today (also added GC and trich)  Return  in about 2 weeks (around 01/07/2020) for Anatomy ultrasound and routine prenatal.  Prentice Docker, MD, [place], [facility] 12/24/2019 3:40 PM

## 2019-12-28 LAB — CERVICOVAGINAL ANCILLARY ONLY
Chlamydia: NEGATIVE
Comment: NEGATIVE
Comment: NEGATIVE
Comment: NORMAL
Neisseria Gonorrhea: NEGATIVE
Trichomonas: NEGATIVE

## 2020-01-08 ENCOUNTER — Ambulatory Visit (INDEPENDENT_AMBULATORY_CARE_PROVIDER_SITE_OTHER): Payer: Medicaid Other

## 2020-01-08 ENCOUNTER — Ambulatory Visit (INDEPENDENT_AMBULATORY_CARE_PROVIDER_SITE_OTHER): Payer: Medicaid Other | Admitting: Certified Nurse Midwife

## 2020-01-08 ENCOUNTER — Other Ambulatory Visit: Payer: Self-pay

## 2020-01-08 VITALS — BP 124/72 | Wt 218.0 lb

## 2020-01-08 DIAGNOSIS — Z3480 Encounter for supervision of other normal pregnancy, unspecified trimester: Secondary | ICD-10-CM

## 2020-01-08 DIAGNOSIS — Z3482 Encounter for supervision of other normal pregnancy, second trimester: Secondary | ICD-10-CM | POA: Diagnosis not present

## 2020-01-08 DIAGNOSIS — Z3A19 19 weeks gestation of pregnancy: Secondary | ICD-10-CM

## 2020-01-08 DIAGNOSIS — J45909 Unspecified asthma, uncomplicated: Secondary | ICD-10-CM | POA: Insufficient documentation

## 2020-01-08 LAB — POCT URINALYSIS DIPSTICK OB
Glucose, UA: NEGATIVE
POC,PROTEIN,UA: NEGATIVE

## 2020-01-09 MED ORDER — PRENATAL VITAMIN/MIN +DHA 27-0.8-200 MG PO CAPS: 1.0000 | ORAL_CAPSULE | Freq: Every day | ORAL | 11 refills | Status: DC

## 2020-01-09 NOTE — Progress Notes (Signed)
ROB/ anatomy scan  at 19wk3 days: Doing well. +FM. Needs prenatal vitamin refill. CGA 19wk3d with anterior placenta and incomplete anatomy scan for 4 chamber heart and LVOT. FCA 149  A: IUP at 19wk3d S=D  P: RTO in 4 weeks for FU anatomy scan and ROB Refill prenatal vitamins  Dalia Heading, CNM

## 2020-02-05 ENCOUNTER — Other Ambulatory Visit: Payer: Medicaid Other

## 2020-02-05 ENCOUNTER — Encounter: Payer: Medicaid Other | Admitting: Obstetrics & Gynecology

## 2020-02-18 ENCOUNTER — Other Ambulatory Visit: Payer: Self-pay

## 2020-02-18 ENCOUNTER — Ambulatory Visit (INDEPENDENT_AMBULATORY_CARE_PROVIDER_SITE_OTHER): Payer: Medicaid Other

## 2020-02-18 ENCOUNTER — Ambulatory Visit (INDEPENDENT_AMBULATORY_CARE_PROVIDER_SITE_OTHER): Payer: Medicaid Other | Admitting: Obstetrics & Gynecology

## 2020-02-18 ENCOUNTER — Encounter: Payer: Self-pay | Admitting: Obstetrics & Gynecology

## 2020-02-18 VITALS — BP 110/70 | Wt 224.0 lb

## 2020-02-18 DIAGNOSIS — Z131 Encounter for screening for diabetes mellitus: Secondary | ICD-10-CM

## 2020-02-18 DIAGNOSIS — Z3A19 19 weeks gestation of pregnancy: Secondary | ICD-10-CM | POA: Diagnosis not present

## 2020-02-18 DIAGNOSIS — Z3482 Encounter for supervision of other normal pregnancy, second trimester: Secondary | ICD-10-CM | POA: Diagnosis not present

## 2020-02-18 DIAGNOSIS — Z3480 Encounter for supervision of other normal pregnancy, unspecified trimester: Secondary | ICD-10-CM

## 2020-02-18 DIAGNOSIS — Z3A25 25 weeks gestation of pregnancy: Secondary | ICD-10-CM

## 2020-02-18 NOTE — Progress Notes (Signed)
  Subjective  Fetal Movement? yes Contractions? no Leaking Fluid? no Vaginal Bleeding? no  Objective  BP 110/70   Wt 224 lb (101.6 kg)   LMP 08/30/2019 (Exact Date)   BMI 34.06 kg/m  General: NAD Pumonary: no increased work of breathing Abdomen: gravid, non-tender Extremities: no edema Psychiatric: mood appropriate, affect full  Review of ULTRASOUND. I have personally reviewed images and report of recent ultrasound done at [facility]. There is a singleton gestation with subjectively normal amniotic fluid volume. The fetal biometry correlates with established dating. Detailed evaluation of the fetal anatomy was performed.The fetal anatomical survey appears within normal limits within the resolution of ultrasound as described above.  It must be noted that a normal ultrasound is unable to rule out fetal aneuploidy.     Assessment  29 y.o. G2P1001 at [redacted]w[redacted]d by  05/31/2020, by Ultrasound presenting for routine prenatal visit  Plan   Problem List Items Addressed This Visit      Other   Supervision of other normal pregnancy, antepartum    Other Visit Diagnoses    [redacted] weeks gestation of pregnancy    -  Primary   Screening for diabetes mellitus       Relevant Orders   28 Week RH+Panel      Pregnancy#2 Problems (from 08/30/19 to present)    Problem Noted Resolved   Supervision of other normal pregnancy, antepartum 11/19/2019 by Will Bonnet, MD No   Overview Addendum 02/18/2020 10:19 AM by Gae Dry, MD    Clinic [facility] Prenatal Labs  Dating 12wk6d [place] Blood type: O/Positive/-- (03/11 1549)   Genetic Screen  [place] XX Antibody:Negative (03/11 1549)  Anatomic [place] Incomplete, anterior placenta. Rubella: 5.52 (03/11 1549) Varicella:NI  GTT Early:  74             Third trimester:  RPR: Non Reactive (03/11 1549)   Rhogam n/a HBsAg: Negative (03/11 1549)   TDaP vaccine                   Flu Shot: HIV: Non Reactive (03/11 1549)   Baby Food                                 GBS:   Contraception Plans BTL, counseled 6/10 Pap:  CBB   Chlamydia positive 3/11  CS/VBAC No   Support Person            [place] nv  Barnett Applebaum, MD, [facility], [facility] 02/18/2020  10:19 AM

## 2020-02-18 NOTE — Patient Instructions (Signed)

## 2020-03-17 ENCOUNTER — Encounter: Payer: Self-pay | Admitting: Advanced Practice Midwife

## 2020-03-17 ENCOUNTER — Other Ambulatory Visit: Payer: Self-pay

## 2020-03-17 ENCOUNTER — Ambulatory Visit (INDEPENDENT_AMBULATORY_CARE_PROVIDER_SITE_OTHER): Payer: Medicaid Other | Admitting: Advanced Practice Midwife

## 2020-03-17 VITALS — BP 105/69 | HR 91 | Wt 228.0 lb

## 2020-03-17 DIAGNOSIS — Z3A29 29 weeks gestation of pregnancy: Secondary | ICD-10-CM

## 2020-03-17 DIAGNOSIS — Z131 Encounter for screening for diabetes mellitus: Secondary | ICD-10-CM

## 2020-03-17 DIAGNOSIS — Z3483 Encounter for supervision of other normal pregnancy, third trimester: Secondary | ICD-10-CM

## 2020-03-17 NOTE — Progress Notes (Signed)
  Routine Prenatal Care Visit  Subjective  Angela Hebert is a 29 y.o. G2P1001 at [redacted]w[redacted]d being seen today for ongoing prenatal care.  She is currently monitored for the following issues for this low-risk pregnancy and has Supervision of other normal pregnancy, antepartum; Skin cancer; Psoriasis; Asthma without status asthmaticus; and Allergic rhinitis on their problem list.  ----------------------------------------------------------------------------------- Patient reports no complaints.   Contractions: Not present. Vag. Bleeding: None.  Movement: Present. Leaking Fluid denies.  ----------------------------------------------------------------------------------- The following portions of the patient's history were reviewed and updated as appropriate: allergies, current medications, past family history, past medical history, past social history, past surgical history and problem list. Problem list updated.  Objective  Blood pressure 105/69, pulse 91, weight 228 lb (103.4 kg), last menstrual period 08/30/2019. Pregravid weight 220 lb (99.8 kg) Total Weight Gain 8 lb (3.629 kg) Urinalysis: Urine Protein    Urine Glucose    Fetal Status: Fetal Heart Rate (bpm): 148 Fundal Height: 30 cm Movement: Present     General:  Alert, oriented and cooperative. Patient is in no acute distress.  Skin: Skin is warm and dry. No rash noted.   Cardiovascular: Normal heart rate noted  Respiratory: Normal respiratory effort, no problems with respiration noted  Abdomen: Soft, gravid, appropriate for gestational age. Pain/Pressure: Absent     Pelvic:  Cervical exam deferred        Extremities: Normal range of motion.  Edema: None  Mental Status: Normal mood and affect. Normal behavior. Normal judgment and thought content.   Assessment   29 y.o. G2P1001 at [redacted]w[redacted]d by  05/31/2020, by Ultrasound presenting for routine prenatal visit  Plan   Pregnancy#2 Problems (from 08/30/19 to present)    Problem Noted Resolved    Supervision of other normal pregnancy, antepartum 11/19/2019 by Will Bonnet, MD No   Overview Addendum 02/18/2020 10:19 AM by Gae Dry, MD    Clinic [facility] Prenatal Labs  Dating 12wk6d [place] Blood type: O/Positive/-- (03/11 1549)   Genetic Screen  [place] XX Antibody:Negative (03/11 1549)  Anatomic [place] Incomplete, anterior placenta. Rubella: 5.52 (03/11 1549) Varicella:NI  GTT Early:  74              Third trimester:  RPR: Non Reactive (03/11 1549)   Rhogam n/a HBsAg: Negative (03/11 1549)   TDaP vaccine                   Flu Shot: HIV: Non Reactive (03/11 1549)   Baby Food Breast                               GBS:   Contraception Plans BTL, counseled 6/10 Pap:  CBB   Chlamydia positive 3/11  CS/VBAC No   Support Person            Previous Version   Chlamydia infection affecting pregnancy 11/23/2019 by Will Bonnet, MD 01/09/2020 by Dalia Heading, CNM    28 week labs today   Preterm labor symptoms and general obstetric precautions including but not limited to vaginal bleeding, contractions, leaking of fluid and fetal movement were reviewed in detail with the patient. Please refer to After Visit Summary for other counseling recommendations.   Return in about 2 weeks (around 03/31/2020) for rob.  Rod Can, CNM 03/17/2020 10:27 AM

## 2020-03-17 NOTE — Progress Notes (Signed)
ROB °28 week labs today °

## 2020-03-18 LAB — 28 WEEK RH+PANEL
Basophils Absolute: 0 10*3/uL (ref 0.0–0.2)
Basos: 0 %
EOS (ABSOLUTE): 0.1 10*3/uL (ref 0.0–0.4)
Eos: 1 %
Gestational Diabetes Screen: 57 mg/dL — ABNORMAL LOW (ref 65–139)
HIV Screen 4th Generation wRfx: NONREACTIVE
Hematocrit: 35.8 % (ref 34.0–46.6)
Hemoglobin: 12.4 g/dL (ref 11.1–15.9)
Immature Grans (Abs): 0.1 10*3/uL (ref 0.0–0.1)
Immature Granulocytes: 1 %
Lymphocytes Absolute: 1 10*3/uL (ref 0.7–3.1)
Lymphs: 12 %
MCH: 32.7 pg (ref 26.6–33.0)
MCHC: 34.6 g/dL (ref 31.5–35.7)
MCV: 95 fL (ref 79–97)
Monocytes Absolute: 0.5 10*3/uL (ref 0.1–0.9)
Monocytes: 6 %
Neutrophils Absolute: 6.3 10*3/uL (ref 1.4–7.0)
Neutrophils: 80 %
Platelets: 203 10*3/uL (ref 150–450)
RBC: 3.79 x10E6/uL (ref 3.77–5.28)
RDW: 11.9 % (ref 11.7–15.4)
RPR Ser Ql: NONREACTIVE
WBC: 8 10*3/uL (ref 3.4–10.8)

## 2020-03-30 ENCOUNTER — Encounter: Payer: Self-pay | Admitting: Advanced Practice Midwife

## 2020-03-30 ENCOUNTER — Other Ambulatory Visit: Payer: Self-pay

## 2020-03-30 ENCOUNTER — Ambulatory Visit (INDEPENDENT_AMBULATORY_CARE_PROVIDER_SITE_OTHER): Payer: Medicaid Other | Admitting: Advanced Practice Midwife

## 2020-03-30 VITALS — BP 119/79 | Wt 229.0 lb

## 2020-03-30 DIAGNOSIS — Z3A31 31 weeks gestation of pregnancy: Secondary | ICD-10-CM

## 2020-03-30 DIAGNOSIS — Z23 Encounter for immunization: Secondary | ICD-10-CM

## 2020-03-30 DIAGNOSIS — Z3483 Encounter for supervision of other normal pregnancy, third trimester: Secondary | ICD-10-CM

## 2020-03-30 LAB — POCT URINALYSIS DIPSTICK OB
Glucose, UA: NEGATIVE
POC,PROTEIN,UA: NEGATIVE

## 2020-03-30 NOTE — Progress Notes (Signed)
ROB TDAP given today Tubal Ligation papers also signed

## 2020-03-30 NOTE — Progress Notes (Signed)
Routine Prenatal Care Visit  Subjective  Angela Hebert is a 29 y.o. G2P1001 at [redacted]w[redacted]d being seen today for ongoing prenatal care.  She is currently monitored for the following issues for this low-risk pregnancy and has Supervision of other normal pregnancy, antepartum; Skin cancer; Psoriasis; Asthma without status asthmaticus; and Allergic rhinitis on their problem list.  ----------------------------------------------------------------------------------- Patient reports baby normally is very active. Beginning last night she has not felt as much movement. She has felt movement throughout the day today- not as much as she has. We discussed changes in quality of movement as baby gets bigger, kick counts, and precautions.   Contractions: Not present. Vag. Bleeding: None.  Leaking Fluid denies.  ----------------------------------------------------------------------------------- The following portions of the patient's history were reviewed and updated as appropriate: allergies, current medications, past family history, past medical history, past social history, past surgical history and problem list. Problem list updated.  Objective  Blood pressure 119/79, weight 229 lb (103.9 kg), last menstrual period 08/30/2019. Pregravid weight 220 lb (99.8 kg) Total Weight Gain 9 lb (4.082 kg) Urinalysis: Urine Protein    Urine Glucose    Fetal Status: Fetal Heart Rate (bpm): 139 Fundal Height: 32 cm Movement: (!) Decreased     General:  Alert, oriented and cooperative. Patient is in no acute distress.  Skin: Skin is warm and dry. No rash noted.   Cardiovascular: Normal heart rate noted  Respiratory: Normal respiratory effort, no problems with respiration noted  Abdomen: Soft, gravid, appropriate for gestational age. Pain/Pressure: Absent     Pelvic:  Cervical exam deferred        Extremities: Normal range of motion.     Mental Status: Normal mood and affect. Normal behavior. Normal judgment and thought  content.   Assessment   29 y.o. G2P1001 at [redacted]w[redacted]d by  05/31/2020, by Ultrasound presenting for routine prenatal visit  Plan   Pregnancy#2 Problems (from 08/30/19 to present)    Problem Noted Resolved   Supervision of other normal pregnancy, antepartum 11/19/2019 by Will Bonnet, MD No   Overview Addendum 03/30/2020  2:26 PM by Rod Can, [facility] Prenatal Labs  Dating 12wk6d [place] Blood type: O/Positive/-- (03/11 1549)   Genetic Screen  [place] XX Antibody:Negative (03/11 1549)  Anatomic [place] Incomplete, anterior placenta. Complete/normal 6/10 Rubella: 5.52 (03/11 1549) Varicella:NI  GTT Early:  74              Third trimester: 57 RPR: Non Reactive (03/11 1549)   Rhogam n/a HBsAg: Negative (03/11 1549)   Vaccines TDAP:  03/30/20                 Flu Shot: HIV: Non Reactive (03/11 1549)   Baby Food                                GBS:   Contraception Plans BTL, counseled 6/10, consent signed 7/21 Pap:  CBB   Chlamydia positive 3/11  CS/VBAC No   Support Person            Previous Version   Chlamydia infection affecting pregnancy 11/23/2019 by Will Bonnet, MD 01/09/2020 by Dalia Heading, CNM       Preterm labor symptoms and general obstetric precautions including but not limited to vaginal bleeding, contractions, leaking of fluid and fetal movement were reviewed in detail with the patient. Please refer to After Visit Summary for other counseling recommendations.  Return in about 2 weeks (around 04/13/2020) for rob.  Rod Can, CNM 03/30/2020 2:47 PM

## 2020-04-13 ENCOUNTER — Encounter: Payer: Self-pay | Admitting: Advanced Practice Midwife

## 2020-04-13 ENCOUNTER — Ambulatory Visit (INDEPENDENT_AMBULATORY_CARE_PROVIDER_SITE_OTHER): Payer: Medicaid Other | Admitting: Advanced Practice Midwife

## 2020-04-13 ENCOUNTER — Other Ambulatory Visit: Payer: Self-pay

## 2020-04-13 VITALS — BP 120/79 | Wt 227.0 lb

## 2020-04-13 DIAGNOSIS — Z3480 Encounter for supervision of other normal pregnancy, unspecified trimester: Secondary | ICD-10-CM

## 2020-04-13 DIAGNOSIS — Z3A33 33 weeks gestation of pregnancy: Secondary | ICD-10-CM

## 2020-04-13 DIAGNOSIS — Z3483 Encounter for supervision of other normal pregnancy, third trimester: Secondary | ICD-10-CM

## 2020-04-13 NOTE — Progress Notes (Signed)
ROB Resigned BTL forms today

## 2020-04-13 NOTE — Progress Notes (Signed)
Routine Prenatal Care Visit  Subjective  Angela Hebert is a 29 y.o. G2P1001 at [redacted]w[redacted]d being seen today for ongoing prenatal care.  She is currently monitored for the following issues for this low-risk pregnancy and has Supervision of other normal pregnancy, antepartum; Skin cancer; Psoriasis; Asthma without status asthmaticus; and Allergic rhinitis on their problem list.  ----------------------------------------------------------------------------------- Patient reports an area on her mid/lower right side that has been hurting for the past 2 weeks. It feels swollen and more painful especially after increased physical activity. It hurts worse with fetal kicks. She has tried heat, ice, tylenol with minimal relief. She denies any urinary symptoms or CVAT. She also feels easily lightheaded/dizzy when laying on her back.   Contractions: Not present. Vag. Bleeding: None.  Movement: Present. Leaking Fluid denies.  ----------------------------------------------------------------------------------- The following portions of the patient's history were reviewed and updated as appropriate: allergies, current medications, past family history, past medical history, past social history, past surgical history and problem list. Problem list updated.  Objective  Blood pressure 120/79, weight 227 lb (103 kg), last menstrual period 08/30/2019. Pregravid weight 220 lb (99.8 kg) Total Weight Gain 7 lb (3.175 kg) Urinalysis: Urine Protein    Urine Glucose    Fetal Status: Fetal Heart Rate (bpm): 136 Fundal Height: 34 cm Movement: Present     General:  Alert, oriented and cooperative. Patient is in no acute distress.  Skin: Skin is warm and dry. No rash noted.   Cardiovascular: Normal heart rate noted  Respiratory: Normal respiratory effort, no problems with respiration noted  Abdomen: Soft, gravid, appropriate for gestational age. Pain/Pressure: Absent     Pelvic:  Cervical exam deferred        Extremities:  Normal range of motion.  Edema: None  Mental Status: Normal mood and affect. Normal behavior. Normal judgment and thought content.   Right side: mid below ribs an area about 5 sq cm with ropey/fibrous feel underlying skin, tender to palpation. Not raised, red, or warm.   Assessment   29 y.o. G2P1001 at [redacted]w[redacted]d by  05/31/2020, by Ultrasound presenting for routine prenatal visit  Plan   Pregnancy#2 Problems (from 08/30/19 to present)    Problem Noted Resolved   Supervision of other normal pregnancy, antepartum 11/19/2019 by Will Bonnet, MD No   Overview Addendum 03/30/2020  2:26 PM by Rod Can, [place] Prenatal Labs  Dating 12wk6d [place] Blood type: O/Positive/-- (03/11 1549)   Genetic Screen  [place] XX Antibody:Negative (03/11 1549)  Anatomic [place] Incomplete, anterior placenta. Complete/normal 6/10 Rubella: 5.52 (03/11 1549) Varicella:NI  GTT Early:  74              Third trimester: 57 RPR: Non Reactive (03/11 1549)   Rhogam n/a HBsAg: Negative (03/11 1549)   Vaccines TDAP:  03/30/20                 Flu Shot: HIV: Non Reactive (03/11 1549)   Baby Food                                GBS:   Contraception Plans BTL, counseled 6/10, consent signed 8/4 Pap:  CBB   Chlamydia positive 3/11  CS/VBAC No   Support Person            Previous Version   Chlamydia infection affecting pregnancy 11/23/2019 by Will Bonnet, MD 01/09/2020 by Dalia Heading, CNM  Pain in side: try heat/ice/position changes/magnesium supplement/epsom salt soaks. Flexeril offered and she prefers to start with magnesium. BTL consent signed today    Preterm labor symptoms and general obstetric precautions including but not limited to vaginal bleeding, contractions, leaking of fluid and fetal movement were reviewed in detail with the patient. Please refer to After Visit Summary for other counseling recommendations.   Return in about 2 weeks (around 04/27/2020) for presentation u/s and  rob with md.  Rod Can, CNM 04/13/2020 2:52 PM

## 2020-04-25 ENCOUNTER — Telehealth: Payer: Self-pay

## 2020-04-25 NOTE — Telephone Encounter (Signed)
Pt calling; is losing her voice; has ? Sinus infection; other offices not seeing new pts.  Can we see her?  [phone]  Adv her ins may not pay for [place] to see her and it would be out of pocket; sugg Dr. Gayland Curry; pt hasn't been there is almost three years so they won't see her; adv an urgent care or [facility].  Pt has been dealing with this for two weeks; has tried claritin and sudafed.  Adv needs to be seen.

## 2020-04-29 ENCOUNTER — Encounter: Payer: Self-pay | Admitting: Obstetrics and Gynecology

## 2020-04-29 ENCOUNTER — Other Ambulatory Visit: Payer: Self-pay

## 2020-04-29 ENCOUNTER — Ambulatory Visit (INDEPENDENT_AMBULATORY_CARE_PROVIDER_SITE_OTHER): Payer: Medicaid Other

## 2020-04-29 ENCOUNTER — Ambulatory Visit (INDEPENDENT_AMBULATORY_CARE_PROVIDER_SITE_OTHER): Payer: Medicaid Other | Admitting: Obstetrics and Gynecology

## 2020-04-29 VITALS — BP 110/80 | Wt 228.0 lb

## 2020-04-29 DIAGNOSIS — Z3483 Encounter for supervision of other normal pregnancy, third trimester: Secondary | ICD-10-CM

## 2020-04-29 DIAGNOSIS — Z3A35 35 weeks gestation of pregnancy: Secondary | ICD-10-CM

## 2020-04-29 DIAGNOSIS — Z3480 Encounter for supervision of other normal pregnancy, unspecified trimester: Secondary | ICD-10-CM

## 2020-04-29 LAB — POCT URINALYSIS DIPSTICK OB
Glucose, UA: NEGATIVE
POC,PROTEIN,UA: NEGATIVE

## 2020-04-29 NOTE — Progress Notes (Signed)
Routine Prenatal Care Visit  Subjective  Angela Hebert is a 29 y.o. G2P1001 at [redacted]w[redacted]d being seen today for ongoing prenatal care.  She is currently monitored for the following issues for this low-risk pregnancy and has Supervision of other normal pregnancy, antepartum; Skin cancer; Psoriasis; Asthma without status asthmaticus; and Allergic rhinitis on their problem list.  ----------------------------------------------------------------------------------- Patient reports no complaints.   Contractions: Not present. Vag. Bleeding: None.  Movement: Present. Leaking Fluid denies.  On u/s Cephalic presentation confirmed. Normal AFI ----------------------------------------------------------------------------------- The following portions of the patient's history were reviewed and updated as appropriate: allergies, current medications, past family history, past medical history, past social history, past surgical history and problem list. Problem list updated.  Objective  Blood pressure 110/80, weight 228 lb (103.4 kg), last menstrual period 08/30/2019. Pregravid weight 220 lb (99.8 kg) Total Weight Gain 8 lb (3.629 kg) Urinalysis: Urine Protein Negative  Urine Glucose Negative  Fetal Status: Fetal Heart Rate (bpm): 160 ([place]) Fundal Height: 35 cm Movement: Present  Presentation: Vertex  General:  Alert, oriented and cooperative. Patient is in no acute distress.  Skin: Skin is warm and dry. No rash noted.   Cardiovascular: Normal heart rate noted  Respiratory: Normal respiratory effort, no problems with respiration noted  Abdomen: Soft, gravid, appropriate for gestational age. Pain/Pressure: Absent     Pelvic:  Cervical exam deferred        Extremities: Normal range of motion.  Edema: None  Mental Status: Normal mood and affect. Normal behavior. Normal judgment and thought content.   Imaging Results US OB Limited  Result Date: 04/29/2020 Patient Name: TASMIA BLUMER DOB: October 21, 1990 MRN:  [id number] ULTRASOUND REPORT Location: [facility] Date of Service: 04/29/2020 Indications: Presentation Findings: Nelda [place] intrauterine pregnancy is visualized with FHR at 160 BPM. Fetal presentation is Cephalic. Placenta: anterior. Grade: 1 AFI: 10.6 cm Impression: 1. [redacted]w[redacted]d Viable [place] Intrauterine pregnancy dated by previously established criteria. 2. AFI is 25.3 cm. 3. Cephalic presentation Gweneth Dimitri, RT The ultrasound images and findings were reviewed by me and I agree with the above report. Prentice Docker, MD, [facility], [facility] 04/29/2020 10:29 AM       Assessment   29 y.o. G2P1001 at [redacted]w[redacted]d by  05/31/2020, by Ultrasound presenting for routine prenatal visit  Plan   Pregnancy#2 Problems (from 08/30/19 to present)    Problem Noted Resolved   Supervision of other normal pregnancy, antepartum 11/19/2019 by Will Bonnet, MD No   Overview Addendum 04/13/2020  3:04 PM by Rod Can, [place] Prenatal Labs  Dating 12wk6d [place] Blood type: O/Positive/-- (03/11 1549)   Genetic Screen  [place] XX Antibody:Negative (03/11 1549)  Anatomic [place] Incomplete, anterior placenta. Complete/normal 6/10 Rubella: 5.52 (03/11 1549) Varicella:NI  GTT Early:  74              Third trimester: 57 RPR: Non Reactive (03/11 1549)   Rhogam n/a HBsAg: Negative (03/11 1549)   Vaccines TDAP:  03/30/20                 Flu Shot: HIV: Non Reactive (03/11 1549)   Baby Food                                GBS:   Contraception Plans BTL, counseled 6/10, consent signed 8/4 Pap:  CBB   Chlamydia positive 3/11  CS/VBAC No   Support  Person            Previous Version   Chlamydia infection affecting pregnancy 11/23/2019 by Will Bonnet, MD 01/09/2020 by Dalia Heading, CNM       Preterm labor symptoms and general obstetric precautions including but not limited to vaginal bleeding, contractions, leaking of fluid and fetal movement were reviewed in  detail with the patient. Please refer to After Visit Summary for other counseling recommendations.   Return in about 1 week (around 05/06/2020) for Routine Prenatal Appointment.  Prentice Docker, MD, [facility], [facility] 04/29/2020 10:40 AM

## 2020-05-09 ENCOUNTER — Other Ambulatory Visit: Payer: Self-pay

## 2020-05-09 ENCOUNTER — Ambulatory Visit (INDEPENDENT_AMBULATORY_CARE_PROVIDER_SITE_OTHER): Payer: Medicaid Other | Admitting: Obstetrics & Gynecology

## 2020-05-09 ENCOUNTER — Encounter: Payer: Self-pay | Admitting: Obstetrics & Gynecology

## 2020-05-09 VITALS — BP 120/80 | Wt 233.0 lb

## 2020-05-09 DIAGNOSIS — Z3483 Encounter for supervision of other normal pregnancy, third trimester: Secondary | ICD-10-CM

## 2020-05-09 DIAGNOSIS — Z113 Encounter for screening for infections with a predominantly sexual mode of transmission: Secondary | ICD-10-CM

## 2020-05-09 DIAGNOSIS — Z3A36 36 weeks gestation of pregnancy: Secondary | ICD-10-CM

## 2020-05-09 DIAGNOSIS — Z3685 Encounter for antenatal screening for Streptococcus B: Secondary | ICD-10-CM

## 2020-05-09 NOTE — Patient Instructions (Signed)

## 2020-05-09 NOTE — Progress Notes (Signed)
°  Subjective  Fetal Movement? yes Contractions? no Leaking Fluid? no Vaginal Bleeding? no  Objective  BP 120/80    Wt 233 lb (105.7 kg)    LMP 08/30/2019 (Exact Date)    BMI 35.43 kg/m  General: NAD Pumonary: no increased work of breathing Abdomen: gravid, non-tender Extremities: no edema Psychiatric: mood appropriate, affect full  Assessment  29 y.o. G2P1001 at [redacted]w[redacted]d by  05/31/2020, by Ultrasound presenting for routine prenatal visit  Plan   Problem List Items Addressed This Visit      Other   Supervision of other normal pregnancy, antepartum    Other Visit Diagnoses    [redacted] weeks gestation of pregnancy    -  Primary   Encounter for antenatal screening for Streptococcus B       Relevant Orders   Culture, beta strep (group b only)      Pregnancy#2 Problems (from 08/30/19 to present)    Problem Noted Resolved   Supervision of other normal pregnancy, antepartum 11/19/2019 by Will Bonnet, MD No   Overview Addendum 04/13/2020  3:04 PM by Rod Can, [place] Prenatal Labs  Dating 12wk6d [place] Blood type: O/Positive/-- (03/11 1549)   Genetic Screen  [place] XX Antibody:Negative (03/11 1549)  Anatomic [place] Incomplete, anterior placenta. Complete/normal 6/10 Rubella: 5.52 (03/11 1549) Varicella:NI  GTT Early:  74              Third trimester: 57 RPR: Non Reactive (03/11 1549)   Rhogam n/a HBsAg: Negative (03/11 1549)   Vaccines TDAP:  03/30/20                 Flu Shot: HIV: Non Reactive (03/11 1549)   Baby Food                                GBS:   Contraception Plans BTL, counseled 6/10, consent signed 8/4 Pap:  CBB   Chlamydia positive 3/11  CS/VBAC No   Support Person            Previous Version   Chlamydia infection affecting pregnancy 11/23/2019 by Will Bonnet, MD 01/09/2020 by Dalia Heading, CNM     GBS and Aptima today  PNV, [facility], Labor precautions  Barnett Applebaum, MD, [facility], [facility] 05/09/2020   3:21 PM

## 2020-05-11 ENCOUNTER — Other Ambulatory Visit: Payer: Self-pay | Admitting: Obstetrics & Gynecology

## 2020-05-11 LAB — CERVICOVAGINAL ANCILLARY ONLY
Bacterial Vaginitis (gardnerella): POSITIVE — AB
Candida Glabrata: NEGATIVE
Candida Vaginitis: POSITIVE — AB
Chlamydia: NEGATIVE
Comment: NEGATIVE
Comment: NEGATIVE
Comment: NEGATIVE
Comment: NEGATIVE
Comment: NEGATIVE
Comment: NORMAL
Neisseria Gonorrhea: NEGATIVE
Trichomonas: NEGATIVE

## 2020-05-11 MED ORDER — METRONIDAZOLE 500 MG PO TABS: 500.0000 mg | ORAL_TABLET | Freq: Two times a day (BID) | ORAL | 0 refills | Status: DC

## 2020-05-11 MED ORDER — FLUCONAZOLE 150 MG PO TABS: 150.0000 mg | ORAL_TABLET | Freq: Once | ORAL | 3 refills | Status: AC

## 2020-05-13 LAB — CULTURE, BETA STREP (GROUP B ONLY): Strep Gp B Culture: POSITIVE — AB

## 2020-05-17 ENCOUNTER — Other Ambulatory Visit: Payer: Self-pay

## 2020-05-17 ENCOUNTER — Ambulatory Visit (INDEPENDENT_AMBULATORY_CARE_PROVIDER_SITE_OTHER): Payer: Medicaid Other | Admitting: Advanced Practice Midwife

## 2020-05-17 ENCOUNTER — Encounter: Payer: Self-pay | Admitting: Advanced Practice Midwife

## 2020-05-17 VITALS — BP 120/80 | Wt 232.0 lb

## 2020-05-17 DIAGNOSIS — Z3A38 38 weeks gestation of pregnancy: Secondary | ICD-10-CM

## 2020-05-17 DIAGNOSIS — Z3480 Encounter for supervision of other normal pregnancy, unspecified trimester: Secondary | ICD-10-CM

## 2020-05-17 DIAGNOSIS — Z23 Encounter for immunization: Secondary | ICD-10-CM | POA: Diagnosis not present

## 2020-05-17 NOTE — Progress Notes (Signed)
  Routine Prenatal Care Visit  Subjective  Angela Hebert is a 29 y.o. G2P1001 at [redacted]w[redacted]d being seen today for ongoing prenatal care.  She is currently monitored for the following issues for this low-risk pregnancy and has Supervision of other normal pregnancy, antepartum; Skin cancer; Psoriasis; Asthma without status asthmaticus; and Allergic rhinitis on their problem list.  ----------------------------------------------------------------------------------- Patient reports a flare up of left ankle tendonitis and some swelling in the same since yesterday.  She is interested in a 40 week induction. Will schedule at next visit. Contractions: Not present. Vag. Bleeding: None.  Movement: Present. Leaking Fluid denies.  ----------------------------------------------------------------------------------- The following portions of the patient's history were reviewed and updated as appropriate: allergies, current medications, past family history, past medical history, past social history, past surgical history and problem list. Problem list updated.  Objective  Blood pressure 120/80, weight 232 lb (105.2 kg), last menstrual period 08/30/2019. Pregravid weight 220 lb (99.8 kg) Total Weight Gain 12 lb (5.443 kg) Urinalysis: Urine Protein    Urine Glucose    Fetal Status: Fetal Heart Rate (bpm): 152   Movement: Present     General:  Alert, oriented and cooperative. Patient is in no acute distress.  Skin: Skin is warm and dry. No rash noted.   Cardiovascular: Normal heart rate noted  Respiratory: Normal respiratory effort, no problems with respiration noted  Abdomen: Soft, gravid, appropriate for gestational age. Pain/Pressure: Present     Pelvic:  Cervical exam deferred        Extremities: Normal range of motion.  Edema: Trace  Mental Status: Normal mood and affect. Normal behavior. Normal judgment and thought content.   Assessment   29 y.o. G2P1001 at [redacted]w[redacted]d by  05/31/2020, by Ultrasound presenting  for routine prenatal visit  Plan   Pregnancy#2 Problems (from 08/30/19 to present)    Problem Noted Resolved   Supervision of other normal pregnancy, antepartum 11/19/2019 by Will Bonnet, MD No   Overview Addendum 04/13/2020  3:04 PM by Rod Can, [place] Prenatal Labs  Dating 12wk6d [place] Blood type: O/Positive/-- (03/11 1549)   Genetic Screen  [place] XX Antibody:Negative (03/11 1549)  Anatomic [place] Incomplete, anterior placenta. Complete/normal 6/10 Rubella: 5.52 (03/11 1549) Varicella:NI  GTT Early:  74              Third trimester: 57 RPR: Non Reactive (03/11 1549)   Rhogam n/a HBsAg: Negative (03/11 1549)   Vaccines TDAP:  03/30/20                 Flu Shot: HIV: Non Reactive (03/11 1549)   Baby Food                                GBS:   Contraception Plans BTL, counseled 6/10, consent signed 8/4 Pap:  CBB   Chlamydia positive 3/11  CS/VBAC No   Support Person            Previous Version   Chlamydia infection affecting pregnancy 11/23/2019 by Will Bonnet, MD 01/09/2020 by Dalia Heading, CNM       Term labor symptoms and general obstetric precautions including but not limited to vaginal bleeding, contractions, leaking of fluid and fetal movement were reviewed in detail with the patient.   Return in about 1 week (around 05/24/2020) for rob.  Rod Can, CNM 05/17/2020 4:27 PM

## 2020-05-25 ENCOUNTER — Other Ambulatory Visit: Payer: Self-pay

## 2020-05-25 ENCOUNTER — Encounter: Payer: Self-pay | Admitting: Advanced Practice Midwife

## 2020-05-25 ENCOUNTER — Ambulatory Visit (INDEPENDENT_AMBULATORY_CARE_PROVIDER_SITE_OTHER): Payer: Medicaid Other | Admitting: Advanced Practice Midwife

## 2020-05-25 VITALS — BP 120/81 | HR 105 | Wt 231.0 lb

## 2020-05-25 DIAGNOSIS — Z3480 Encounter for supervision of other normal pregnancy, unspecified trimester: Secondary | ICD-10-CM

## 2020-05-25 DIAGNOSIS — Z3A39 39 weeks gestation of pregnancy: Secondary | ICD-10-CM

## 2020-05-25 NOTE — Progress Notes (Signed)
ROB

## 2020-05-25 NOTE — Progress Notes (Signed)
  Routine Prenatal Care Visit  Subjective  Angela Hebert is a 29 y.o. G2P1001 at [redacted]w[redacted]d being seen today for ongoing prenatal care.  She is currently monitored for the following issues for this low-risk pregnancy and has Supervision of other normal pregnancy, antepartum; Skin cancer; Psoriasis; Asthma without status asthmaticus; and Allergic rhinitis on their problem list.  ----------------------------------------------------------------------------------- Patient reports some abdominal pain and pelvic pressure associated with fetal position.   Contractions: Not present. Vag. Bleeding: None.  Movement: Present. Leaking Fluid denies.  ----------------------------------------------------------------------------------- The following portions of the patient's history were reviewed and updated as appropriate: allergies, current medications, past family history, past medical history, past social history, past surgical history and problem list. Problem list updated.  Objective  Blood pressure 120/81, pulse (!) 105, weight 231 lb (104.8 kg), last menstrual period 08/30/2019. Pregravid weight 220 lb (99.8 kg) Total Weight Gain 11 lb (4.99 kg) Urinalysis: Urine Protein    Urine Glucose    Fetal Status: Fetal Heart Rate (bpm): 138   Movement: Present  Presentation: Vertex  General:  Alert, oriented and cooperative. Patient is in no acute distress.  Skin: Skin is warm and dry. No rash noted.   Cardiovascular: Normal heart rate noted  Respiratory: Normal respiratory effort, no problems with respiration noted  Abdomen: Soft, gravid, appropriate for gestational age. Pain/Pressure: Present     Pelvic:  Cervical exam performed Dilation: 1.5 Effacement (%): 40, 50 Station: -3  Extremities: Normal range of motion.     Mental Status: Normal mood and affect. Normal behavior. Normal judgment and thought content.   Assessment   29 y.o. G2P1001 at [redacted]w[redacted]d by  05/31/2020, by Ultrasound presenting for routine  prenatal visit  Plan   Pregnancy#2 Problems (from 08/30/19 to present)    Problem Noted Resolved   Supervision of other normal pregnancy, antepartum 11/19/2019 by Will Bonnet, MD No   Overview Addendum 04/13/2020  3:04 PM by Rod Can, [place] Prenatal Labs  Dating 12wk6d [place] Blood type: O/Positive/-- (03/11 1549)   Genetic Screen  [place] XX Antibody:Negative (03/11 1549)  Anatomic [place] Incomplete, anterior placenta. Complete/normal 6/10 Rubella: 5.52 (03/11 1549) Varicella:NI  GTT Early:  74              Third trimester: 57 RPR: Non Reactive (03/11 1549)   Rhogam n/a HBsAg: Negative (03/11 1549)   Vaccines TDAP:  03/30/20                 Flu Shot: HIV: Non Reactive (03/11 1549)   Baby Food                                GBS:   Contraception Plans BTL, counseled 6/10, consent signed 8/4 Pap:  CBB   Chlamydia positive 3/11  CS/VBAC No   Support Person            Previous Version   Chlamydia infection affecting pregnancy 11/23/2019 by Will Bonnet, MD 01/09/2020 by Dalia Heading, CNM       Term labor symptoms and general obstetric precautions including but not limited to vaginal bleeding, contractions, leaking of fluid and fetal movement were reviewed in detail with the patient. Please refer to After Visit Summary for other counseling recommendations.   Return in about 1 week (around 06/01/2020) for rob.  Rod Can, CNM 05/25/2020 9:31 AM

## 2020-05-30 ENCOUNTER — Other Ambulatory Visit: Payer: Self-pay

## 2020-05-30 ENCOUNTER — Ambulatory Visit (INDEPENDENT_AMBULATORY_CARE_PROVIDER_SITE_OTHER): Payer: Medicaid Other | Admitting: Obstetrics & Gynecology

## 2020-05-30 ENCOUNTER — Telehealth: Payer: Self-pay

## 2020-05-30 ENCOUNTER — Encounter: Payer: Self-pay | Admitting: Obstetrics & Gynecology

## 2020-05-30 VITALS — BP 120/80 | Wt 231.0 lb

## 2020-05-30 DIAGNOSIS — Z3A39 39 weeks gestation of pregnancy: Secondary | ICD-10-CM | POA: Diagnosis not present

## 2020-05-30 DIAGNOSIS — R1030 Lower abdominal pain, unspecified: Secondary | ICD-10-CM

## 2020-05-30 DIAGNOSIS — O26899 Other specified pregnancy related conditions, unspecified trimester: Secondary | ICD-10-CM

## 2020-05-30 NOTE — Progress Notes (Signed)
   Gynecology Pelvic Pain Evaluation   Chief Complaint:  Chief Complaint  Patient presents with  . Contractions    History of Present Illness:   Patient is a 29 y.o. G2P1001 who LMP was Patient's last menstrual period was 08/30/2019 (exact date)., presents today for a problem visit.  She complains of pain.   Her pain is localized to the suprapubic and uterine pain, more in front and not back pain,  area, described as intermittent, began several days ago and its severity is described as moderate. The pain radiates to the  Non-radiating. She has these associated symptoms which include fetal movements (as she is [redacted] weeks pregnant). Patient has these modifiers which include relaxation that make it better and unable to associate with any factor that make it worse.  Context includes: pregnant [facility] 05/31/20).  Past history includes no other concerns; prior NSVD after gradual natural labor.  PMHx: She  has a past medical history of Asthma, Crohn disease ([place]), Diverticulitis, Family history of hemophilia, and Psoriasis. Also,  has a past surgical history that includes Colonoscopy; Esophagogastroduodenoscopy; Tonsillectomy; and Laparoscopic ovarian cystectomy (N/A, 03/29/2015)., family history includes Hemophilia in her maternal uncle.,  reports that she has quit smoking. Her smoking use included cigarettes. She has a 1.00 pack-year smoking history. She has never used smokeless tobacco. She reports that she does not drink alcohol and does not use drugs.  She has a current medication list which includes the following prescription(s): metronidazole and prenatal gummies/dha & fa. Also, is allergic to latex.  Review of Systems  Gastrointestinal: Positive for abdominal pain.  All other systems reviewed and are negative.   Objective: BP 120/80   Wt 231 lb (104.8 kg)   LMP 08/30/2019 (Exact Date)   BMI 35.12 kg/m  Physical Exam Constitutional:      General: She is not in acute distress.    Appearance:  She is well-developed.  Genitourinary:     Vulva, urethra, bladder and vagina normal.     Cervix is parous.     Uterus is enlarged.     Genitourinary Comments: SVE 1-2/50/-3 VTX  Abdominal:     Comments: Gravid, ND, mild T to palp, FHT 150s  Musculoskeletal:        General: Normal range of motion.  Neurological:     Mental Status: She is alert and oriented to person, place, and time.  Skin:    General: Skin is warm and dry.  Vitals reviewed.     Female chaperone present for pelvic portion of the physical exam  Assessment: 29 y.o. G2P1001 with :.  1. Pregnancy related bilateral lower abdominal pain, antepartum  2. [redacted] weeks gestation of pregnancy  Monitor for s/sx labor No s/sx other etiology for her pain Keep appt Thurs Consider IOL by 1 weeks  Barnett Applebaum, MD, [facility], [facility] 05/30/2020  10:47 AM

## 2020-05-30 NOTE — Telephone Encounter (Signed)
Patient reports a lot of cramping over the weekend. Some has gone away, some has lingered. It's starting to become worse. Uncertain whether she should go to hospital or if she can come in to be seen. [phone]

## 2020-05-30 NOTE — Telephone Encounter (Signed)
Spoke w/patient. She hardly slept d/t ctx last night, seems to have calmed down this morning. She would like to be checked today if possible. Scheduled apt today with RPH at 10:20

## 2020-05-31 ENCOUNTER — Inpatient Hospital Stay: Admit: 2020-05-31 | Payer: Self-pay

## 2020-06-02 ENCOUNTER — Ambulatory Visit (INDEPENDENT_AMBULATORY_CARE_PROVIDER_SITE_OTHER): Payer: Medicaid Other | Admitting: Obstetrics and Gynecology

## 2020-06-02 ENCOUNTER — Other Ambulatory Visit: Payer: Self-pay

## 2020-06-02 ENCOUNTER — Encounter: Payer: Self-pay | Admitting: Obstetrics and Gynecology

## 2020-06-02 VITALS — BP 110/75 | Wt 233.0 lb

## 2020-06-02 DIAGNOSIS — Z3A40 40 weeks gestation of pregnancy: Secondary | ICD-10-CM

## 2020-06-02 DIAGNOSIS — Z3483 Encounter for supervision of other normal pregnancy, third trimester: Secondary | ICD-10-CM

## 2020-06-02 LAB — POCT URINALYSIS DIPSTICK OB
Glucose, UA: NEGATIVE
POC,PROTEIN,UA: NEGATIVE

## 2020-06-02 NOTE — Progress Notes (Signed)
Routine Prenatal Care Visit  Subjective  Angela Hebert is a 29 y.o. G2P1001 at [redacted]w[redacted]d being seen today for ongoing prenatal care.  She is currently monitored for the following issues for this low-risk pregnancy and has Supervision of other normal pregnancy, antepartum; Skin cancer; Psoriasis; Asthma without status asthmaticus; and Allergic rhinitis on their problem list.  ----------------------------------------------------------------------------------- Patient reports no complaints.   Contractions: Irregular. Vag. Bleeding: None.  Movement: Present. Leaking Fluid denies.  ----------------------------------------------------------------------------------- The following portions of the patient's history were reviewed and updated as appropriate: allergies, current medications, past family history, past medical history, past social history, past surgical history and problem list. Problem list updated.  Objective  Blood pressure 110/75, weight 233 lb (105.7 kg), last menstrual period 08/30/2019. Pregravid weight 220 lb (99.8 kg) Total Weight Gain 13 lb (5.897 kg) Urinalysis: Urine Protein Negative  Urine Glucose Negative  Fetal Status: Fetal Heart Rate (bpm): 130   Movement: Present  Presentation: Vertex  General:  Alert, oriented and cooperative. Patient is in no acute distress.  Skin: Skin is warm and dry. No rash noted.   Cardiovascular: Normal heart rate noted  Respiratory: Normal respiratory effort, no problems with respiration noted  Abdomen: Soft, gravid, appropriate for gestational age. Pain/Pressure: Present     Pelvic:  Cervical exam performed Dilation: 1.5 Effacement (%): 50 Station: -3  Extremities: Normal range of motion.     Mental Status: Normal mood and affect. Normal behavior. Normal judgment and thought content.   NST: Baseline FHR: 130 beats/min Variability: moderate Accelerations: present Decelerations: absent Tocometry: not done  Interpretation:  INDICATIONS:  post-dates pregnancy RESULTS:  A NST procedure was performed with FHR monitoring and a normal baseline established, appropriate time of 20-40 minutes of evaluation, and accels >2 seen w 15x15 characteristics.  Results show a REACTIVE NST.    Assessment   29 y.o. G2P1001 at [redacted]w[redacted]d by  05/31/2020, by Ultrasound presenting for routine prenatal visit  Plan   Pregnancy#2 Problems (from 08/30/19 to present)    Problem Noted Resolved   Supervision of other normal pregnancy, antepartum 11/19/2019 by Will Bonnet, MD No   Overview Addendum 04/13/2020  3:04 PM by Rod Can, [place] Prenatal Labs  Dating 12wk6d [place] Blood type: O/Positive/-- (03/11 1549)   Genetic Screen  Media XX Antibody:Negative (03/11 1549)  Anatomic [place] Incomplete, anterior placenta. Complete/normal 6/10 Rubella: 5.52 (03/11 1549) Varicella:NI  GTT Early:  74              Third trimester: 58 RPR: Non Reactive (03/11 1549)   Rhogam n/a HBsAg: Negative (03/11 1549)   Vaccines TDAP:  03/30/20                 Flu Shot: HIV: Non Reactive (03/11 1549)   Baby Food                                GBS:   Contraception Plans BTL, counseled 6/10, consent signed 8/4 Pap:  CBB   Chlamydia positive 3/11  CS/VBAC No   Support Person            Previous Version   Chlamydia infection affecting pregnancy 11/23/2019 by Will Bonnet, MD 01/09/2020 by Dalia Heading, CNM       Term labor symptoms and general obstetric precautions including but not limited to vaginal bleeding, contractions, leaking of fluid and fetal movement were reviewed in detail  with the patient. Please refer to After Visit Summary for other counseling recommendations.   Return for KEEP IOL appt for 9/28 at 0500.  Prentice Docker, MD, [facility], [facility] 06/02/2020 10:47 AM  ROB- would like cervix check

## 2020-06-02 NOTE — Patient Instructions (Signed)
For induction of labor:  1) get COVID19 test on Monday 9/27 between 9-10 at the [address].  Stay in car and wear your mask. This should be a relatively quick process.  2) Present to the ER at 0500 on 9/28 for your induction of labor appointment.

## 2020-06-06 ENCOUNTER — Other Ambulatory Visit: Payer: Self-pay

## 2020-06-06 ENCOUNTER — Other Ambulatory Visit
Admission: RE | Admit: 2020-06-06 | Discharge: 2020-06-06 | Disposition: A | Discharge status: Home / Self Care | Payer: Medicaid Other | Source: Ambulatory Visit | Attending: Obstetrics and Gynecology | Admitting: Obstetrics and Gynecology

## 2020-06-06 DIAGNOSIS — Z20822 Contact with and (suspected) exposure to covid-19: Secondary | ICD-10-CM | POA: Insufficient documentation

## 2020-06-06 DIAGNOSIS — Z01812 Encounter for preprocedural laboratory examination: Secondary | ICD-10-CM | POA: Insufficient documentation

## 2020-06-06 LAB — SARS CORONAVIRUS 2 (TAT 6-24 HRS): SARS Coronavirus 2: NEGATIVE

## 2020-06-07 ENCOUNTER — Inpatient Hospital Stay: Payer: Medicaid Other | Admitting: Anesthesiology

## 2020-06-07 ENCOUNTER — Encounter
Admission: EM | Disposition: A | Discharge status: Home / Self Care | Payer: Self-pay | Source: Home / Self Care | Attending: Obstetrics and Gynecology

## 2020-06-07 ENCOUNTER — Encounter: Payer: Self-pay | Admitting: Obstetrics and Gynecology

## 2020-06-07 ENCOUNTER — Inpatient Hospital Stay
Admission: EM | Admit: 2020-06-07 | Discharge: 2020-06-08 | DRG: 797 | Disposition: A | Discharge status: Home / Self Care | Payer: Medicaid Other | Attending: Obstetrics and Gynecology | Admitting: Obstetrics and Gynecology

## 2020-06-07 DIAGNOSIS — Z3A41 41 weeks gestation of pregnancy: Secondary | ICD-10-CM

## 2020-06-07 DIAGNOSIS — Z87891 Personal history of nicotine dependence: Secondary | ICD-10-CM | POA: Diagnosis not present

## 2020-06-07 DIAGNOSIS — Z20822 Contact with and (suspected) exposure to covid-19: Secondary | ICD-10-CM | POA: Diagnosis present

## 2020-06-07 DIAGNOSIS — Z3480 Encounter for supervision of other normal pregnancy, unspecified trimester: Secondary | ICD-10-CM

## 2020-06-07 DIAGNOSIS — O99824 Streptococcus B carrier state complicating childbirth: Secondary | ICD-10-CM | POA: Diagnosis present

## 2020-06-07 DIAGNOSIS — Z3490 Encounter for supervision of normal pregnancy, unspecified, unspecified trimester: Secondary | ICD-10-CM | POA: Diagnosis present

## 2020-06-07 DIAGNOSIS — D62 Acute posthemorrhagic anemia: Secondary | ICD-10-CM | POA: Diagnosis not present

## 2020-06-07 DIAGNOSIS — O480 Post-term pregnancy: Principal | ICD-10-CM | POA: Diagnosis present

## 2020-06-07 DIAGNOSIS — Z302 Encounter for sterilization: Secondary | ICD-10-CM

## 2020-06-07 DIAGNOSIS — O9081 Anemia of the puerperium: Secondary | ICD-10-CM | POA: Diagnosis not present

## 2020-06-07 HISTORY — PX: TUBAL LIGATION: SHX77

## 2020-06-07 LAB — CBC
HCT: 35.6 % — ABNORMAL LOW (ref 36.0–46.0)
Hemoglobin: 12.5 g/dL (ref 12.0–15.0)
MCH: 30.1 pg (ref 26.0–34.0)
MCHC: 35.1 g/dL (ref 30.0–36.0)
MCV: 85.8 fL (ref 80.0–100.0)
Platelets: 204 10*3/uL (ref 150–400)
RBC: 4.15 MIL/uL (ref 3.87–5.11)
RDW: 12.9 % (ref 11.5–15.5)
WBC: 10.8 10*3/uL — ABNORMAL HIGH (ref 4.0–10.5)
nRBC: 0 % (ref 0.0–0.2)

## 2020-06-07 LAB — TYPE AND SCREEN
ABO/RH(D): O POS
Antibody Screen: NEGATIVE

## 2020-06-07 LAB — RPR: RPR Ser Ql: NONREACTIVE

## 2020-06-07 SURGERY — LIGATION, FALLOPIAN TUBE, POSTPARTUM
Anesthesia: Spinal | Laterality: Bilateral

## 2020-06-07 SURGERY — LIGATION, FALLOPIAN TUBE, POSTPARTUM
Anesthesia: Choice | Laterality: Bilateral

## 2020-06-07 MED ORDER — DIPHENHYDRAMINE HCL 25 MG PO CAPS: 25.0000 mg | ORAL_CAPSULE | Freq: Four times a day (QID) | ORAL | Status: DC | PRN

## 2020-06-07 MED ORDER — VARICELLA VIRUS VACCINE LIVE 1350 PFU/0.5ML IJ SUSR
0.5000 mL | INTRAMUSCULAR | Status: DC | PRN
  Filled 2020-06-07: qty 0.5

## 2020-06-07 MED ORDER — MISOPROSTOL 25 MCG QUARTER TABLET
25.0000 ug | ORAL_TABLET | ORAL | Status: DC | PRN
  Administered 2020-06-07: 25 ug via VAGINAL
  Filled 2020-06-07: qty 1

## 2020-06-07 MED ORDER — POVIDONE-IODINE 10 % EX SWAB: 2.0000 "application " | Freq: Once | CUTANEOUS | Status: DC

## 2020-06-07 MED ORDER — ACETAMINOPHEN 500 MG PO TABS
1000.0000 mg | ORAL_TABLET | Freq: Four times a day (QID) | ORAL | Status: DC | PRN
  Administered 2020-06-07: 1000 mg via ORAL

## 2020-06-07 MED ORDER — LACTATED RINGERS IV SOLN: INTRAVENOUS | Status: DC

## 2020-06-07 MED ORDER — SODIUM CHLORIDE 0.9 % IV SOLN
5.0000 10*6.[IU] | Freq: Once | INTRAVENOUS | Status: AC
  Administered 2020-06-07: 5 10*6.[IU] via INTRAVENOUS
  Filled 2020-06-07: qty 5

## 2020-06-07 MED ORDER — OXYTOCIN 10 UNIT/ML IJ SOLN
INTRAMUSCULAR | Status: AC
  Filled 2020-06-07: qty 1

## 2020-06-07 MED ORDER — SIMETHICONE 80 MG PO CHEW: 80.0000 mg | CHEWABLE_TABLET | ORAL | Status: DC | PRN

## 2020-06-07 MED ORDER — ACETAMINOPHEN 325 MG PO TABS
650.0000 mg | ORAL_TABLET | ORAL | Status: DC | PRN
  Administered 2020-06-07 – 2020-06-08 (×3): 650 mg via ORAL
  Filled 2020-06-07 (×3): qty 2

## 2020-06-07 MED ORDER — FENTANYL CITRATE (PF) 100 MCG/2ML IJ SOLN
INTRAMUSCULAR | Status: AC
  Filled 2020-06-07: qty 2

## 2020-06-07 MED ORDER — WITCH HAZEL-GLYCERIN EX PADS: 1.0000 "application " | MEDICATED_PAD | CUTANEOUS | Status: DC | PRN

## 2020-06-07 MED ORDER — BUPIVACAINE HCL (PF) 0.25 % IJ SOLN
INTRAMUSCULAR | Status: DC | PRN
  Administered 2020-06-07 (×2): 5 mL via EPIDURAL

## 2020-06-07 MED ORDER — BUPIVACAINE HCL (PF) 0.5 % IJ SOLN
INTRAMUSCULAR | Status: DC | PRN
  Administered 2020-06-07: 2 mL

## 2020-06-07 MED ORDER — MIDAZOLAM HCL 2 MG/2ML IJ SOLN
INTRAMUSCULAR | Status: AC
  Filled 2020-06-07: qty 2

## 2020-06-07 MED ORDER — PROPOFOL 10 MG/ML IV BOLUS
INTRAVENOUS | Status: AC
  Filled 2020-06-07: qty 20

## 2020-06-07 MED ORDER — SODIUM CHLORIDE 0.9 % IV SOLN
INTRAVENOUS | Status: DC | PRN
  Administered 2020-06-07: 50 ug/min via INTRAVENOUS

## 2020-06-07 MED ORDER — EPHEDRINE 5 MG/ML INJ: 10.0000 mg | INTRAVENOUS | Status: DC | PRN

## 2020-06-07 MED ORDER — MISOPROSTOL 200 MCG PO TABS
ORAL_TABLET | ORAL | Status: AC
  Filled 2020-06-07: qty 4

## 2020-06-07 MED ORDER — BUPIVACAINE LIPOSOME 1.3 % IJ SUSP
INTRAMUSCULAR | Status: AC
  Filled 2020-06-07: qty 20

## 2020-06-07 MED ORDER — TERBUTALINE SULFATE 1 MG/ML IJ SOLN: 0.2500 mg | Freq: Once | INTRAMUSCULAR | Status: DC | PRN

## 2020-06-07 MED ORDER — PHENYLEPHRINE 40 MCG/ML (10ML) SYRINGE FOR IV PUSH (FOR BLOOD PRESSURE SUPPORT): 80.0000 ug | PREFILLED_SYRINGE | INTRAVENOUS | Status: DC | PRN

## 2020-06-07 MED ORDER — ACETAMINOPHEN 500 MG PO TABS
ORAL_TABLET | ORAL | Status: AC
  Filled 2020-06-07: qty 2

## 2020-06-07 MED ORDER — FENTANYL 2.5 MCG/ML W/ROPIVACAINE 0.15% IN NS 100 ML EPIDURAL (ARMC)
12.0000 mL/h | EPIDURAL | Status: DC
  Administered 2020-06-07: 12 mL/h via EPIDURAL

## 2020-06-07 MED ORDER — BUPIVACAINE LIPOSOME 1.3 % IJ SUSP
INTRAMUSCULAR | Status: DC | PRN
  Administered 2020-06-07: 6 mL
  Administered 2020-06-07: 7 mL

## 2020-06-07 MED ORDER — LIDOCAINE HCL (PF) 1 % IJ SOLN
INTRAMUSCULAR | Status: DC | PRN
  Administered 2020-06-07: 3 mL

## 2020-06-07 MED ORDER — EPHEDRINE SULFATE 50 MG/ML IJ SOLN
INTRAMUSCULAR | Status: DC | PRN
  Administered 2020-06-07: 5 mg via INTRAVENOUS

## 2020-06-07 MED ORDER — ONDANSETRON HCL 4 MG/2ML IJ SOLN: 4.0000 mg | INTRAMUSCULAR | Status: DC | PRN

## 2020-06-07 MED ORDER — PRENATAL MULTIVITAMIN CH
1.0000 | ORAL_TABLET | Freq: Every day | ORAL | Status: DC
  Administered 2020-06-08: 1 via ORAL
  Filled 2020-06-07: qty 1

## 2020-06-07 MED ORDER — COCONUT OIL OIL: 1.0000 "application " | TOPICAL_OIL | Status: DC | PRN

## 2020-06-07 MED ORDER — PROPOFOL 500 MG/50ML IV EMUL
INTRAVENOUS | Status: DC | PRN
  Administered 2020-06-07: 30 ug/kg/min via INTRAVENOUS

## 2020-06-07 MED ORDER — HYDROCODONE-ACETAMINOPHEN 5-325 MG PO TABS: 1.0000 | ORAL_TABLET | Freq: Four times a day (QID) | ORAL | Status: DC | PRN

## 2020-06-07 MED ORDER — PENICILLIN G POT IN DEXTROSE 60000 UNIT/ML IV SOLN
3.0000 10*6.[IU] | INTRAVENOUS | Status: DC
  Administered 2020-06-07: 3 10*6.[IU] via INTRAVENOUS
  Filled 2020-06-07: qty 50

## 2020-06-07 MED ORDER — FENTANYL 2.5 MCG/ML W/ROPIVACAINE 0.15% IN NS 100 ML EPIDURAL (ARMC)
EPIDURAL | Status: AC
  Filled 2020-06-07: qty 100

## 2020-06-07 MED ORDER — OXYTOCIN BOLUS FROM INFUSION
333.0000 mL | Freq: Once | INTRAVENOUS | Status: AC
  Administered 2020-06-07: 333 mL via INTRAVENOUS

## 2020-06-07 MED ORDER — AMMONIA AROMATIC IN INHA
RESPIRATORY_TRACT | Status: AC
  Filled 2020-06-07: qty 10

## 2020-06-07 MED ORDER — FERROUS SULFATE 325 (65 FE) MG PO TABS
325.0000 mg | ORAL_TABLET | Freq: Two times a day (BID) | ORAL | Status: DC
  Administered 2020-06-08: 325 mg via ORAL
  Filled 2020-06-07: qty 1

## 2020-06-07 MED ORDER — OXYTOCIN 10 UNIT/ML IJ SOLN
10.0000 [IU] | Freq: Once | INTRAMUSCULAR | Status: DC
  Filled 2020-06-07: qty 1

## 2020-06-07 MED ORDER — DIPHENHYDRAMINE HCL 50 MG/ML IJ SOLN: 12.5000 mg | INTRAMUSCULAR | Status: DC | PRN

## 2020-06-07 MED ORDER — FENTANYL CITRATE (PF) 100 MCG/2ML IJ SOLN
25.0000 ug | INTRAMUSCULAR | Status: DC | PRN
  Administered 2020-06-07 (×4): 25 ug via INTRAVENOUS

## 2020-06-07 MED ORDER — IBUPROFEN 600 MG PO TABS
600.0000 mg | ORAL_TABLET | Freq: Four times a day (QID) | ORAL | Status: DC
  Administered 2020-06-07 – 2020-06-08 (×4): 600 mg via ORAL
  Filled 2020-06-07 (×4): qty 1

## 2020-06-07 MED ORDER — LACTATED RINGERS IV SOLN: INTRAVENOUS | Status: DC | PRN

## 2020-06-07 MED ORDER — DIBUCAINE (PERIANAL) 1 % EX OINT: 1.0000 "application " | TOPICAL_OINTMENT | CUTANEOUS | Status: DC | PRN

## 2020-06-07 MED ORDER — ONDANSETRON HCL 4 MG/2ML IJ SOLN: 4.0000 mg | Freq: Four times a day (QID) | INTRAMUSCULAR | Status: DC | PRN

## 2020-06-07 MED ORDER — SENNOSIDES-DOCUSATE SODIUM 8.6-50 MG PO TABS
2.0000 | ORAL_TABLET | ORAL | Status: DC
  Administered 2020-06-08: 2 via ORAL
  Filled 2020-06-07: qty 2

## 2020-06-07 MED ORDER — SOD CITRATE-CITRIC ACID 500-334 MG/5ML PO SOLN: 30.0000 mL | ORAL | Status: DC | PRN

## 2020-06-07 MED ORDER — LACTATED RINGERS IV SOLN
500.0000 mL | INTRAVENOUS | Status: DC | PRN
  Administered 2020-06-07: 500 mL via INTRAVENOUS

## 2020-06-07 MED ORDER — BENZOCAINE-MENTHOL 20-0.5 % EX AERO: 1.0000 "application " | INHALATION_SPRAY | CUTANEOUS | Status: DC | PRN

## 2020-06-07 MED ORDER — LIDOCAINE-EPINEPHRINE (PF) 1.5 %-1:200000 IJ SOLN
INTRAMUSCULAR | Status: DC | PRN
  Administered 2020-06-07: 3 mL via PERINEURAL

## 2020-06-07 MED ORDER — ONDANSETRON HCL 4 MG PO TABS: 4.0000 mg | ORAL_TABLET | ORAL | Status: DC | PRN

## 2020-06-07 MED ORDER — LACTATED RINGERS IV SOLN: 500.0000 mL | Freq: Once | INTRAVENOUS | Status: DC

## 2020-06-07 MED ORDER — OXYTOCIN-SODIUM CHLORIDE 30-0.9 UT/500ML-% IV SOLN
2.5000 [IU]/h | INTRAVENOUS | Status: DC
  Filled 2020-06-07: qty 500

## 2020-06-07 MED ORDER — LIDOCAINE HCL (PF) 1 % IJ SOLN
30.0000 mL | INTRAMUSCULAR | Status: DC | PRN
  Filled 2020-06-07: qty 30

## 2020-06-07 SURGICAL SUPPLY — 36 items
ADH SKN CLS APL DERMABOND .7 (GAUZE/BANDAGES/DRESSINGS) ×1
APL PRP STRL LF DISP 70% ISPRP (MISCELLANEOUS) ×1
BLADE SURG SZ11 CARB STEEL (BLADE) ×3 IMPLANT
CHLORAPREP W/TINT 26 (MISCELLANEOUS) ×3 IMPLANT
COVER WAND RF STERILE (DRAPES) ×3 IMPLANT
DERMABOND ADVANCED (GAUZE/BANDAGES/DRESSINGS) ×2
DERMABOND ADVANCED .7 DNX12 (GAUZE/BANDAGES/DRESSINGS) ×1 IMPLANT
DRAPE LAPAROTOMY 77X122 PED (DRAPES) ×3 IMPLANT
ELECT CAUTERY BLADE 6.4 (BLADE) ×3 IMPLANT
ELECT REM PT RETURN 9FT ADLT (ELECTROSURGICAL) ×3
ELECTRODE REM PT RTRN 9FT ADLT (ELECTROSURGICAL) ×1 IMPLANT
GLOVE BIO SURGEON STRL SZ7 (GLOVE) ×3 IMPLANT
GLOVE BIOGEL PI IND STRL 7.5 (GLOVE) ×1 IMPLANT
GLOVE BIOGEL PI INDICATOR 7.5 (GLOVE) ×2
GOWN STRL REUS W/ TWL LRG LVL3 (GOWN DISPOSABLE) ×2 IMPLANT
GOWN STRL REUS W/ TWL XL LVL3 (GOWN DISPOSABLE) ×1 IMPLANT
GOWN STRL REUS W/TWL LRG LVL3 (GOWN DISPOSABLE) ×6
GOWN STRL REUS W/TWL XL LVL3 (GOWN DISPOSABLE) ×3
KIT TURNOVER CYSTO (KITS) ×3 IMPLANT
LABEL OR SOLS (LABEL) ×3 IMPLANT
NEEDLE HYPO 22GX1.5 SAFETY (NEEDLE) ×3 IMPLANT
NS IRRIG 500ML POUR BTL (IV SOLUTION) ×3 IMPLANT
PACK BASIN MINOR (MISCELLANEOUS) ×3 IMPLANT
PAPER ECG REC THERMAL (MISCELLANEOUS) ×3 IMPLANT
RETRACTOR RING XSMALL (MISCELLANEOUS) ×1 IMPLANT
RETRACTOR WOUND ALXS 18CM SML (MISCELLANEOUS) IMPLANT
RTRCTR WOUND ALEXIS 13CM XS SH (MISCELLANEOUS) ×3
RTRCTR WOUND ALEXIS O 18CM SML (MISCELLANEOUS)
SUT MNCRL 4-0 (SUTURE) ×3
SUT MNCRL 4-0 27XMFL (SUTURE) ×1
SUT PLAIN GUT 0 (SUTURE) ×6 IMPLANT
SUT VIC AB 2-0 UR6 27 (SUTURE) ×3 IMPLANT
SUT VIC AB 3-0 SH 27 (SUTURE) ×3
SUT VIC AB 3-0 SH 27X BRD (SUTURE) ×1 IMPLANT
SUTURE MNCRL 4-0 27XMF (SUTURE) ×1 IMPLANT
SYR 10ML LL (SYRINGE) ×3 IMPLANT

## 2020-06-07 SURGICAL SUPPLY — 33 items
BLADE SURG SZ11 CARB STEEL (BLADE) ×3 IMPLANT
CHLORAPREP W/TINT 26 (MISCELLANEOUS) ×3 IMPLANT
COVER WAND RF STERILE (DRAPES) ×3 IMPLANT
DERMABOND ADVANCED (GAUZE/BANDAGES/DRESSINGS) ×2
DERMABOND ADVANCED .7 DNX12 (GAUZE/BANDAGES/DRESSINGS) ×1 IMPLANT
DRAPE LAPAROTOMY 77X122 PED (DRAPES) ×3 IMPLANT
ELECT CAUTERY BLADE 6.4 (BLADE) ×3 IMPLANT
ELECT REM PT RETURN 9FT ADLT (ELECTROSURGICAL) ×3
ELECTRODE REM PT RTRN 9FT ADLT (ELECTROSURGICAL) ×1 IMPLANT
GLOVE BIO SURGEON STRL SZ7 (GLOVE) ×3 IMPLANT
GLOVE BIOGEL PI IND STRL 7.5 (GLOVE) ×1 IMPLANT
GLOVE BIOGEL PI INDICATOR 7.5 (GLOVE) ×2
GOWN STRL REUS W/ TWL LRG LVL3 (GOWN DISPOSABLE) ×2 IMPLANT
GOWN STRL REUS W/ TWL XL LVL3 (GOWN DISPOSABLE) ×1 IMPLANT
GOWN STRL REUS W/TWL LRG LVL3 (GOWN DISPOSABLE) ×4
GOWN STRL REUS W/TWL XL LVL3 (GOWN DISPOSABLE) ×2
KIT TURNOVER CYSTO (KITS) ×3 IMPLANT
LABEL OR SOLS (LABEL) ×3 IMPLANT
NEEDLE HYPO 22GX1.5 SAFETY (NEEDLE) ×3 IMPLANT
NS IRRIG 500ML POUR BTL (IV SOLUTION) ×3 IMPLANT
PACK BASIN MINOR (MISCELLANEOUS) ×3 IMPLANT
RETRACTOR RING XSMALL (MISCELLANEOUS) IMPLANT
RETRACTOR WOUND ALXS 18CM SML (MISCELLANEOUS) IMPLANT
RTRCTR WOUND ALEXIS 13CM XS SH (MISCELLANEOUS)
RTRCTR WOUND ALEXIS O 18CM SML (MISCELLANEOUS)
SUT MNCRL 4-0 (SUTURE) ×2
SUT MNCRL 4-0 27XMFL (SUTURE) ×1
SUT PLAIN GUT 0 (SUTURE) ×6 IMPLANT
SUT VIC AB 2-0 UR6 27 (SUTURE) ×3 IMPLANT
SUT VIC AB 3-0 SH 27 (SUTURE) ×2
SUT VIC AB 3-0 SH 27X BRD (SUTURE) ×1 IMPLANT
SUTURE MNCRL 4-0 27XMF (SUTURE) ×1 IMPLANT
SYR 10ML LL (SYRINGE) ×3 IMPLANT

## 2020-06-07 NOTE — Anesthesia Preprocedure Evaluation (Addendum)
Anesthesia Evaluation  Patient identified by MRN, date of birth, ID band Patient awake    Reviewed: Allergy & Precautions, H&P , NPO status , Patient's Chart, lab work & pertinent test results  History of Anesthesia Complications Negative for: history of anesthetic complications  Airway Mallampati: II  TM Distance: >3 FB Neck ROM: full    Dental no notable dental hx.    Pulmonary asthma , former smoker,    Pulmonary exam normal        Cardiovascular negative cardio ROS Normal cardiovascular exam     Neuro/Psych negative neurological ROS  negative psych ROS   GI/Hepatic negative GI ROS, Neg liver ROS,   Endo/Other  negative endocrine ROS  Renal/GU negative Renal ROS  negative genitourinary   Musculoskeletal   Abdominal   Peds  Hematology negative hematology ROS (+)   Anesthesia Other Findings   Reproductive/Obstetrics (+) Pregnancy                            Anesthesia Physical Anesthesia Plan  ASA: II  Anesthesia Plan:    Post-op Pain Management:    Induction:   PONV Risk Score and Plan:   Airway Management Planned:   Additional Equipment:   Intra-op Plan:   Post-operative Plan:   Informed Consent:   Plan Discussed with:   Anesthesia Plan Comments:         Anesthesia Quick Evaluation

## 2020-06-07 NOTE — Op Note (Signed)
Operative Report Postpartum Tubal Ligation  Pre-Op Diagnosis: multiparity, desires permanent sterilization  Post-Op Diagnosis: multiparity, desires permanent sterilization  Procedures:  Postpartum tubal ligation via Pomeroy method  Primary Surgeon: Prentice Docker, MD  EBL: 3 ml   IVF: 300 mL   Anesthesia: Spinal  Specimens: portion of right and left fallopian tubes  Drains: None  Complications: None   Disposition: PACU   Condition: Stable   Findings: normal appearing bilateral fallopian tubes  Indication: The patient is a 29 y.o. [id number] who is postpartum day 0 status post spontaneous vaginal delivery.  She has been counseled extensively regarding risks, benefits, and alternatives to tubal ligation, including non-permanent forms of contraception that are equivalent in efficacy with potentially better side effects.  She has been advised that there is a failure rate of 5 in every 1,000 tubal ligations per year with an increased risk of ectopic pregnancy should pregnancy occur.   Procedure Summary:  The patient was taken to the operating room where spinal anesthesia was administered and found to be adequate. After a timeout was called a small transverse, infraumbilical incision was made with the scalpel. The incision was carried down through the fascia until the peritoneum was identified and entered. The peritoneum was noted to be free of any adhesions and the incision was then extended.  The patient's left fallopian tube was identified, brought incision, and grasped with a Babcock clamp. The tube was then followed out to the fimbria. The Babcock clamp was then used to grasp the tube approximately 4 cm from the cornual region. A 3 cm segment of tube was then ligated with the 2 free ties of plain gut, and excised. Good hemostasis was noted and the tube was returned to the abdomen. The right fallopian tube was then ligated, and a 3 cm segment excised in a similar fashion. Excellent  hemostasis was noted, and the tube returned to the abdomen.  The fascia was closed in a single layer using 2-0 Vicryl. The subcutaneous space was closed using 3-0 Vicryl in a running fashion to close dead space and to reduce tension on the skin closure. The skin was closed in a subcuticular fashion using 4-0 monocryl. The closure was also closed with Dermabond.  Sponge, lap, needle, and instrument counts were correct x 2.  VTE prophylaxis: SCDs. Antibiotic prophylaxis: none indicated. The patient tolerated the procedure well and was taken to the PACU in stable condition.   Prentice Docker, MD 06/07/2020 6:47 PM

## 2020-06-07 NOTE — Anesthesia Preprocedure Evaluation (Signed)
Anesthesia Evaluation  Patient identified by MRN, date of birth, ID band Patient awake    Reviewed: Allergy & Precautions, H&P , NPO status , Patient's Chart, lab work & pertinent test results  History of Anesthesia Complications Negative for: history of anesthetic complications  Airway Mallampati: II  TM Distance: >3 FB Neck ROM: full    Dental no notable dental hx.    Pulmonary neg shortness of breath, asthma , neg recent URI, former smoker,    Pulmonary exam normal        Cardiovascular negative cardio ROS Normal cardiovascular exam     Neuro/Psych negative neurological ROS  negative psych ROS   GI/Hepatic negative GI ROS, Neg liver ROS,   Endo/Other  negative endocrine ROS  Renal/GU negative Renal ROS  negative genitourinary   Musculoskeletal   Abdominal   Peds  Hematology negative hematology ROS (+)   Anesthesia Other Findings Past Medical History: No date: Asthma No date: Crohn disease ([place])     Comment:  pt states not diagnosed No date: Diverticulitis No date: Family history of hemophilia     Comment:  PTS MATERNAL GRANDMOTHER, ALL OTHER WOMEN IN FAMILY ARE               CARRIERS No date: Psoriasis   Reproductive/Obstetrics (+) Pregnancy                             Anesthesia Physical  Anesthesia Plan  ASA: II  Anesthesia Plan: Spinal   Post-op Pain Management:    Induction:   PONV Risk Score and Plan:   Airway Management Planned: Natural Airway  Additional Equipment:   Intra-op Plan:   Post-operative Plan:   Informed Consent: I have reviewed the patients History and Physical, chart, labs and discussed the procedure including the risks, benefits and alternatives for the proposed anesthesia with the patient or authorized representative who has indicated his/her understanding and acceptance.     Dental Advisory Given  Plan Discussed with: Anesthesiologist  and CRNA  Anesthesia Plan Comments:         Anesthesia Quick Evaluation

## 2020-06-07 NOTE — Anesthesia Procedure Notes (Signed)
Epidural Patient location during procedure: OB Start time: 06/07/2020 1:07 PM End time: 06/07/2020 1:28 PM  Staffing Resident/CRNA: Nelda Marseille, CRNA Performed: resident/CRNA   Preanesthetic Checklist Completed: patient identified, IV checked, site marked, risks and benefits discussed, surgical consent, monitors and equipment checked, pre-op evaluation and timeout performed  Epidural Patient position: sitting Prep: Betadine Patient monitoring: heart rate, continuous pulse ox and blood pressure Approach: midline Location: L3-L4 Injection technique: LOR air  Needle:  Needle type: Tuohy  Needle gauge: 17 G Needle length: 9 cm and 9 Catheter type: closed end flexible Catheter size: 20 Guage Test dose: negative and 1.5% lidocaine with Epi 1:200 K  Assessment Sensory level: T10 Events: blood not aspirated, injection not painful, no injection resistance, no paresthesia and negative IV test  Additional Notes   Patient tolerated the insertion well without complications.Reason for block:procedure for pain

## 2020-06-07 NOTE — Discharge Summary (Signed)
Postpartum Discharge Summary   Patient Name: Angela Hebert DOB: 24-Nov-1990 MRN: [id number]  Date of admission: 06/07/2020 Delivery date:06/07/2020  Delivering provider: Prentice Docker D  Date of discharge: 06/08/2020  Admitting diagnosis: Encounter for induction of labor [Z34.90] Intrauterine pregnancy: [redacted]w[redacted]d    Secondary diagnosis:  Principal Problem:   Supervision of other normal pregnancy, antepartum Active Problems:   Encounter for induction of labor   [redacted] weeks gestation of pregnancy   Encounter for sterilization  Additional problems: none    Discharge diagnosis: Term Pregnancy Delivered                                              Post partum procedures:postpartum tubal ligation Augmentation: none, induced with one dose of misoprostol Complications: None  Hospital course: Induction of Labor With Vaginal Delivery   29y.o. yo G2P1001 at 418w0das admitted to the hospital 06/07/2020 for induction of labor.  Indication for induction: Postdates.  Patient had an uncomplicated labor course as follows: Membrane Rupture Time/Date: 1:36 PM ,06/07/2020   Delivery Method:Vaginal, Spontaneous  Episiotomy: None  Lacerations:  1st degree;Vaginal;Perineal  Details of delivery can be found in separate delivery note.  Patient had a routine postpartum course. Bilateral Tubal Ligation done after delivery.  Patient is discharged home 06/08/20.  Newborn Data: Birth date:06/07/2020  Birth time:1:43 PM  Gender:Female Remi Living status:Living  Apgars:8 ,9  Weight:4010 g   Magnesium Sulfate received: No BMZ received: No Rhophylac:No MMR:No T-DaP:Given prenatally  On 03/30/2020 Flu: Yes vaccine given 05/17/2020 Transfusion:No  Physical exam  Vitals:   06/07/20 2104 06/07/20 2310 06/08/20 0317 06/08/20 0834  BP: 127/72 125/73 122/85 119/71  Pulse: 71 82 69 71  Resp: 20 20 18 20   Temp: 99.2 F (37.3 C) 98.8 F (37.1 C) 98.2 F (36.8 C) 98.6 F (37 C)  TempSrc: Oral Oral Oral  Oral  SpO2: 99% 98% 100% 99%  Weight:      Height:       General: alert, cooperative and no distress Lochia: appropriate Uterine Fundus: firm Incision: Healing well with no significant drainage DVT Evaluation: No evidence of DVT seen on physical exam. Labs: Lab Results  Component Value Date   WBC 11.3 (H) 06/08/2020   HGB 10.5 (L) 06/08/2020   HCT 29.5 (L) 06/08/2020   MCV 86.5 06/08/2020   PLT 180 06/08/2020   No flowsheet data found. Edinburgh Score: Edinburgh Postnatal Depression Scale Screening Tool 06/07/2020  I have been able to laugh and see the funny side of things. 0  I have looked forward with enjoyment to things. 0  I have blamed myself unnecessarily when things went wrong. 2  I have been anxious or worried for no good reason. 2  I have felt scared or panicky for no good reason. 0  Things have been getting on top of me. 0  I have been so unhappy that I have had difficulty sleeping. 0  I have felt sad or miserable. 0  I have been so unhappy that I have been crying. 0  The thought of harming myself has occurred to me. 0  Edinburgh Postnatal Depression Scale Total 4      After visit meds:  Allergies as of 06/08/2020      Reactions   Latex Rash, Itching, Swelling      Medication List  TAKE these medications   magnesium 30 MG tablet Take 30 mg by mouth 2 (two) times daily.   oxyCODONE 5 MG immediate release tablet Commonly known as: Roxicodone Take 1 tablet (5 mg total) by mouth every 6 (six) hours as needed for up to 3 days for severe pain.   Prenatal Gummies/DHA & FA 0.4-32.5 MG Chew Chew 1 tablet by mouth daily.        Discharge home in stable condition Infant Feeding: Breast Infant Disposition:home with mother Discharge instruction: per After Visit Summary and Postpartum booklet. Activity: Advance as tolerated. Pelvic rest for 6 weeks.  Diet: routine diet Anticipated Birth Control: BTL done PP Postpartum Appointment:6 weeks Future  Appointments:No future appointments.   Follow up Visit:  Follow-up Information    Will Bonnet, MD. Schedule an appointment as soon as possible for a visit in 6 week(s).   Specialty: Obstetrics and Gynecology Why: Six weeks postpartum appointment Contact information: [address] [place] [address] [phone]               SIGNED: Christean Leaf, CNM [facility] 06/08/2020, 2:47 PM

## 2020-06-07 NOTE — Progress Notes (Signed)
29 y.o. [id number] with undesired fertility, desires permanent sterilization.  Other reversible forms of contraception were discussed with patient; she declines all other modalities. Permanent nature of as well as associated risks of the procedure discussed with patient including but not limited to: risk of regret, permanence of method, bleeding, infection, injury to surrounding organs and need for additional procedures.  Failure risk of 0.5-1% with increased risk of ectopic gestation if pregnancy occurs was also discussed with patient.   She is immediately postpartum and would like to proceed.  Prentice Docker, MD, [facility], [facility] 06/07/2020 2:46 PM

## 2020-06-07 NOTE — Progress Notes (Signed)
Angela Hebert, CNM and RNs at [facility] with patient for SVE. Upon completion of SVE, audible fetal deceleration heard on monitor. First thought to be maternal coincidence, monitor adjustment showed that the deceleration was, in fact, true FHR. Patient's position changed and monitors readjusted with FHR returning to previous baseline. Will continue to monitor closely.

## 2020-06-07 NOTE — H&P (Signed)
Angela Hebert is a 29 y.o. female presenting for induction of labor at [redacted] weeks gestation.. OB History    Gravida  2   Para  1   Term  1   Preterm      AB      Living  1     SAB      TAB      Ectopic      Multiple      Live Births  1          Past Medical History:  Diagnosis Date  . Asthma   . Crohn disease ([place])    pt states not diagnosed  . Diverticulitis   . Family history of hemophilia    PTS MATERNAL GRANDMOTHER, ALL OTHER WOMEN IN FAMILY ARE CARRIERS  . Psoriasis    Past Surgical History:  Procedure Laterality Date  . COLONOSCOPY    . ESOPHAGOGASTRODUODENOSCOPY    . LAPAROSCOPIC OVARIAN CYSTECTOMY N/A 03/29/2015   Procedure: LAPAROSCOPIC RIGHT OVARIAN CYSTECTOMY;  Surgeon: Angela Halim, MD;  Location: [facility] ORS;  Service: Gynecology;  Laterality: N/A;  . TONSILLECTOMY     Family History: family history includes Hemophilia in her maternal uncle. Social History:  reports that she has quit smoking. Her smoking use included cigarettes. She has a 1.00 pack-year smoking history. She has never used smokeless tobacco. She reports that she does not drink alcohol and does not use drugs.     Maternal Diabetes: No Genetic Screening: Normal Maternal Ultrasounds/Referrals: Normal Fetal Ultrasounds or other Referrals:  None Maternal Substance Abuse:  No Significant Maternal Medications:  None Significant Maternal Lab Results:  Group B Strep positive Other Comments:  None  Review of Systems  Constitutional: Negative.   HENT: Negative.   Eyes: Negative.   Respiratory: Negative.   Cardiovascular: Negative.   Gastrointestinal: Negative.   Endocrine: Negative.   Genitourinary: Negative.   Musculoskeletal: Negative.   Allergic/Immunologic: Negative.   Neurological: Negative.   Hematological: Negative.   Psychiatric/Behavioral: Negative.    History Dilation: 3 Effacement (%): 40 Station: -3 Exam by:: Angela Hebert Blood pressure 128/79, pulse (!) 102,  temperature 98.8 F (37.1 C), temperature source Oral, resp. rate 18, height 5\' 7"  (1.702 m), weight 104.3 kg, last menstrual period 08/30/2019. Maternal Exam:  Uterine Assessment: Contraction strength is mild.  Contraction frequency is rare.   Abdomen: Patient reports no abdominal tenderness. Estimated fetal weight is >8lbs.   Fetal presentation: vertex  Introitus: Normal vulva. Normal vagina.  Ferning test: not done.  Nitrazine test: not done.  Pelvis: adequate for delivery.   Cervix: Cervix evaluated by sterile speculum exam.     Physical Exam Constitutional:      Appearance: Normal appearance. She is obese.  HENT:     Head: Normocephalic and atraumatic.  Eyes:     Extraocular Movements: Extraocular movements intact.     Pupils: Pupils are equal, round, and reactive to light.  Cardiovascular:     Rate and Rhythm: Normal rate and regular rhythm.     Pulses: Normal pulses.     Heart sounds: Normal heart sounds.  Pulmonary:     Effort: Pulmonary effort is normal.     Breath sounds: Normal breath sounds.  Abdominal:     General: Bowel sounds are normal.     Palpations: Abdomen is soft.  Genitourinary:    General: Normal vulva.  Musculoskeletal:        General: Normal range of motion.     Cervical  back: Normal range of motion and neck supple.  Skin:    General: Skin is warm and dry.  Neurological:     General: No focal deficit present.     Mental Status: She is alert and oriented to person, place, and time.  Psychiatric:        Mood and Affect: Mood normal.        Behavior: Behavior normal.     Prenatal labs: ABO, Rh: O/Positive/-- (03/11 1549) Antibody: Negative (03/11 1549) Rubella: 5.52 (03/11 1549) RPR: Non Reactive (07/08 1100)  HBsAg: Negative (03/11 1549)  HIV: Non Reactive (07/08 1100)  GBS: Positive/-- (08/30 1519)   Assessment/Plan: IUP 41 weeks for induction of labor. GBS positive Bishop score 4 Will likely commence IOL with vaginal Cytotec, then  move to pitocin and AROM. Antibiotics for GBS status.   Angela Hebert 06/07/2020, 6:53 AM

## 2020-06-07 NOTE — Transfer of Care (Signed)
Immediate Anesthesia Transfer of Care Note  Patient: Angela Hebert  Procedure(s) Performed: POST PARTUM TUBAL LIGATION (Bilateral )  Patient Location: PACU  Anesthesia Type:Spinal  Level of Consciousness: awake, alert , oriented and patient cooperative  Airway & Oxygen Therapy: Patient Spontanous Breathing  Post-op Assessment: Report given to RN and Post -op Vital signs reviewed and stable  Post vital signs: Reviewed and stable  Last Vitals:  Vitals Value Taken Time  BP 97/74 06/07/20 1844  Temp 36.2 C 06/07/20 1841  Pulse 83 06/07/20 1846  Resp 14 06/07/20 1846  SpO2 100 % 06/07/20 1846  Vitals shown include unvalidated device data.  Last Pain:  Vitals:   06/07/20 1700  TempSrc:   PainSc: 0-No pain         Complications: No complications documented.

## 2020-06-07 NOTE — Anesthesia Procedure Notes (Signed)
Spinal  Patient location during procedure: OR Start time: 06/07/2020 5:35 PM End time: 06/07/2020 5:44 PM Staffing Performed: resident/CRNA  Anesthesiologist: Martha Clan, MD Resident/CRNA: Tollie Eth, CRNA Preanesthetic Checklist Completed: patient identified, IV checked, site marked, risks and benefits discussed, surgical consent, monitors and equipment checked, pre-op evaluation and timeout performed Spinal Block Patient position: sitting Prep: Betadine Patient monitoring: heart rate, continuous pulse ox, blood pressure and cardiac monitor Approach: midline Location: L4-5 Injection technique: single-shot Needle Needle type: Whitacre and Introducer  Needle gauge: 24 G Needle length: 9 cm Additional Notes Negative paresthesia. Negative blood return. Positive free-flowing CSF. Expiration date of kit checked and confirmed. Patient tolerated procedure well, without complications.

## 2020-06-07 NOTE — Progress Notes (Signed)
  Labor Progress Note   29 y.o. G2P1001 @ [redacted]w[redacted]d , admitted for  Pregnancy, Labor Management.   Subjective:  Feels frequent contractions and rates pain at 5/10. She plans to have an epidural at some point. She has been out of bed and now back in bed- side lying. She is unable to tolerate more than a minute lying on her back without feeling faint. We discussed fetal decel most likely related to vaginal exam/sweep.   Objective:  BP (!) 118/49 (BP Location: Right Arm)   Pulse 75   Temp 98.7 F (37.1 C) (Oral)   Resp 17   Ht 5\' 7"  (1.702 m)   Wt 104.3 kg   LMP 08/30/2019 (Exact Date)   BMI 36.02 kg/m  Abd: gravid, ND, FHT present, mild tenderness on exam Extr: trace edema SVE: 4/60-70/-2, sweep, membranes intact  EFM: FHR: 140 bpm, variability: moderate,  accelerations:  Present,  decelerations:  Present deceleration to 90 for 1 minute just following cervical exam. Patient moved to left lateral with good recovery Toco: Frequency: Every 2-4 minutes  Labs: I have reviewed the patient's lab results.   Assessment & Plan:  G2P1001 @ [redacted]w[redacted]d, admitted for  Pregnancy and Labor/Delivery Management  1. Pain management: position changes. Epidural as desired 2. FWB: FHT category II and overall reassuring.  3. ID: GBS positive; status post first dose of penicillin 4. Labor management: start pitocin as needed, AROM when appropriate  All discussed with patient, see orders   Rod Can, [facility] 06/07/2020  10:57 AM

## 2020-06-08 ENCOUNTER — Encounter: Payer: Self-pay | Admitting: Obstetrics and Gynecology

## 2020-06-08 LAB — CBC
HCT: 29.5 % — ABNORMAL LOW (ref 36.0–46.0)
Hemoglobin: 10.5 g/dL — ABNORMAL LOW (ref 12.0–15.0)
MCH: 30.8 pg (ref 26.0–34.0)
MCHC: 35.6 g/dL (ref 30.0–36.0)
MCV: 86.5 fL (ref 80.0–100.0)
Platelets: 180 10*3/uL (ref 150–400)
RBC: 3.41 MIL/uL — ABNORMAL LOW (ref 3.87–5.11)
RDW: 13.2 % (ref 11.5–15.5)
WBC: 11.3 10*3/uL — ABNORMAL HIGH (ref 4.0–10.5)
nRBC: 0 % (ref 0.0–0.2)

## 2020-06-08 MED ORDER — OXYCODONE HCL 5 MG PO TABS: 5.0000 mg | ORAL_TABLET | Freq: Four times a day (QID) | ORAL | 0 refills | Status: AC | PRN

## 2020-06-08 NOTE — Anesthesia Postprocedure Evaluation (Signed)
Anesthesia Post Note  Patient: Angela Hebert  Procedure(s) Performed: POST PARTUM TUBAL LIGATION (Bilateral )  Patient location during evaluation: Mother Baby Anesthesia Type: Spinal Level of consciousness: oriented and awake and alert Pain management: pain level controlled Vital Signs Assessment: post-procedure vital signs reviewed and stable Respiratory status: spontaneous breathing and respiratory function stable Cardiovascular status: blood pressure returned to baseline and stable Postop Assessment: no headache, no backache, no apparent nausea or vomiting and able to ambulate Anesthetic complications: no   No complications documented.   Last Vitals:  Vitals:   06/07/20 2310 06/08/20 0317  BP: 125/73 122/85  Pulse: 82 69  Resp: 20 18  Temp: 37.1 C 36.8 C  SpO2: 98% 100%    Last Pain:  Vitals:   06/08/20 0317  TempSrc: Oral  PainSc:                  Jerrye Noble

## 2020-06-08 NOTE — Anesthesia Postprocedure Evaluation (Signed)
Anesthesia Post Note  Patient: Angela Hebert  Procedure(s) Performed: AN AD HOC LABOR EPIDURAL  Patient location during evaluation: Mother Baby Anesthesia Type: Epidural Level of consciousness: awake and alert Pain management: pain level controlled Vital Signs Assessment: post-procedure vital signs reviewed and stable Respiratory status: spontaneous breathing, nonlabored ventilation and respiratory function stable Cardiovascular status: stable Postop Assessment: no headache, no backache and epidural receding Anesthetic complications: no   No complications documented.   Last Vitals:  Vitals:   06/07/20 2310 06/08/20 0317  BP: 125/73 122/85  Pulse: 82 69  Resp: 20 18  Temp: 37.1 C 36.8 C  SpO2: 98% 100%    Last Pain:  Vitals:   06/08/20 0317  TempSrc: Oral  PainSc:                  Jerrye Noble

## 2020-06-08 NOTE — Progress Notes (Signed)
Patient discharged home with infant. Discharge instructions, prescriptions and follow up appointment given to and reviewed with patient. Patient verbalized understanding  

## 2020-06-08 NOTE — Lactation Note (Signed)
This note was copied from a baby's chart. Lactation Consultation Note  Patient Name: Angela Hebert PBDHD'I Date: 06/08/2020 Reason for consult: Initial assessment;Term  Lactation visit. G2P2 mom, SVD 19hrs ago followed by BTL reports breastfeeding to be going well. Mom BF her first for 6-7 months with supplementation beginning around month 5 due to perceived low milk supply.  No pain or discomfort reported with BF and baby has void/stools above expectations for HOL. LC provided basic BF education, newborn stomach size, feeding behaviors, early feeding cues, growth spurts/cluster feeding, signs of adequate transfer, milk supply and normal course of lactation. Reviewed breast fullness/engorgement, management of both and nipple care.  Mom feels confident in her breastfeeding experience, and how well baby is feeding at this time, information given for outpatient lactation services and community breastfeeding resources. Encouraged to call out today for support/questions if needed before discharge.  Maternal Data Formula Feeding for Exclusion: No Has patient been taught Hand Expression?: Yes Does the patient have breastfeeding experience prior to this delivery?: Yes  Feeding Feeding Type: Breast Fed  LATCH Score                   Interventions Interventions: Breast feeding basics reviewed  Lactation Tools Discussed/Used     Consult Status Consult Status: Complete    [name] 06/08/2020, 9:20 AM

## 2020-06-08 NOTE — Progress Notes (Signed)
Subjective:  Doing well postpartum day 1: She is tolerating regular diet. Her pain is controlled with PO medication. She mentions pain in her bilateral ribs that hurts especially with deep breath, following tubal ligation last night. She is ambulating and voiding without difficulty. She reports breastfeeding is going well. She would like to discharge to home later today pending newborn screens.  Objective:  Vital signs in last 24 hours: Temp:  [97.2 F (36.2 C)-99.2 F (37.3 C)] 98.6 F (37 C) (09/29 0834) Pulse Rate:  [66-131] 71 (09/29 0834) Resp:  [12-20] 20 (09/29 0834) BP: (97-149)/(51-99) 119/71 (09/29 0834) SpO2:  [97 %-100 %] 99 % (09/29 0834)    General: NAD Pulmonary: no increased work of breathing Abdomen: non-distended, non-tender, fundus firm at level of umbilicus Extremities: no edema, no erythema, no tenderness  Results for orders placed or performed during the hospital encounter of 06/07/20 (from the past 72 hour(s))  CBC     Status: Abnormal   Collection Time: 06/07/20  5:52 AM  Result Value Ref Range   WBC 10.8 (H) 4.0 - 10.5 K/uL   RBC 4.15 3.87 - 5.11 MIL/uL   Hemoglobin 12.5 12.0 - 15.0 g/dL   HCT 35.6 (L) 36 - 46 %   MCV 85.8 80.0 - 100.0 fL   MCH 30.1 26.0 - 34.0 pg   MCHC 35.1 30.0 - 36.0 g/dL   RDW 12.9 11.5 - 15.5 %   Platelets 204 150 - 400 K/uL   nRBC 0.0 0.0 - 0.2 %    Comment: Performed at [facility], [address] [place], [place] [address]  RPR     Status: None   Collection Time: 06/07/20  5:52 AM  Result Value Ref Range   RPR Ser Ql NON REACTIVE NON REACTIVE    Comment: Performed at [facility], [address] [place], [place] [address]  Type and screen     Status: None   Collection Time: 06/07/20  5:52 AM  Result Value Ref Range   ABO/RH(D) O POS    Antibody Screen NEG    Sample Expiration      06/10/2020,2359 Performed at [facility], [address] [place], [place] [address]   CBC     Status:  Abnormal   Collection Time: 06/08/20  6:00 AM  Result Value Ref Range   WBC 11.3 (H) 4.0 - 10.5 K/uL   RBC 3.41 (L) 3.87 - 5.11 MIL/uL   Hemoglobin 10.5 (L) 12.0 - 15.0 g/dL   HCT 29.5 (L) 36 - 46 %   MCV 86.5 80.0 - 100.0 fL   MCH 30.8 26.0 - 34.0 pg   MCHC 35.6 30.0 - 36.0 g/dL   RDW 13.2 11.5 - 15.5 %   Platelets 180 150 - 400 K/uL   nRBC 0.0 0.0 - 0.2 %    Comment: Performed at [facility], [address], [place], [place] [address]    Assessment:   29 y.o. [phone] postpartum day # 1, lactating, s/p bilateral tubal ligation  Plan:    1) Acute blood loss anemia - hemodynamically stable and asymptomatic - po ferrous sulfate  2) Blood Type --/--/O POS (09/28 6270) / Rubella 5.52 (03/11 1549) / Varicella Non-Immune  3) TDAP status up to date  4) Feeding plan breast  5)  Education given regarding options for contraception, as well as compatibility with breast feeding if applicable.  Patient is s/p tubal ligation for contraception.  6) Pain in  ribs: continue PO pain medications and rest. Return for worsening symptoms  7) Disposition: possible discharge to home after 24 hour newborn screens   Rod Can, [facility] 06/08/2020, 11:43 AM

## 2020-06-09 LAB — SURGICAL PATHOLOGY

## 2020-07-27 ENCOUNTER — Other Ambulatory Visit (HOSPITAL_COMMUNITY)
Admission: RE | Admit: 2020-07-27 | Discharge: 2020-07-27 | Disposition: A | Discharge status: Home / Self Care | Payer: Medicaid Other | Source: Ambulatory Visit | Attending: Obstetrics and Gynecology | Admitting: Obstetrics and Gynecology

## 2020-07-27 ENCOUNTER — Other Ambulatory Visit: Payer: Self-pay

## 2020-07-27 ENCOUNTER — Encounter: Payer: Self-pay | Admitting: Obstetrics and Gynecology

## 2020-07-27 ENCOUNTER — Ambulatory Visit (INDEPENDENT_AMBULATORY_CARE_PROVIDER_SITE_OTHER): Payer: Medicaid Other | Admitting: Obstetrics and Gynecology

## 2020-07-27 DIAGNOSIS — Z113 Encounter for screening for infections with a predominantly sexual mode of transmission: Secondary | ICD-10-CM | POA: Diagnosis present

## 2020-07-27 NOTE — Progress Notes (Signed)
Postpartum Visit  Chief Complaint:  Chief Complaint  Patient presents with  . Postpartum Care    History of Present Illness: Patient is a 29 y.o. D7O2423 presents for postpartum visit.  Date of delivery: 06/07/2020 Type of delivery: Vaginal delivery - Vacuum or forceps assisted  no Episiotomy No.  Laceration: 1st degree Pregnancy or labor problems:  no Any problems since the delivery: no  Newborn Details:  SINGLETON :  1. Baby's name: Remi. Birth weight: 8.13lb Maternal Details:  Breast Feeding:  yes Post partum depression/anxiety noted:  A little anxiety lately [place] Post-Partum Depression Score:  9  Date of last PAP: 11/19/2019, NILM - chlamydia positive  Past Medical History:  Diagnosis Date  . Asthma   . Crohn disease ([place])    pt states not diagnosed  . Diverticulitis   . Family history of hemophilia    PTS MATERNAL GRANDMOTHER, ALL OTHER WOMEN IN FAMILY ARE CARRIERS  . Psoriasis     Past Surgical History:  Procedure Laterality Date  . COLONOSCOPY    . ESOPHAGOGASTRODUODENOSCOPY    . LAPAROSCOPIC OVARIAN CYSTECTOMY N/A 03/29/2015   Procedure: LAPAROSCOPIC RIGHT OVARIAN CYSTECTOMY;  Surgeon: Aletha Halim, MD;  Location: [facility] ORS;  Service: Gynecology;  Laterality: N/A;  . TONSILLECTOMY    . TUBAL LIGATION Bilateral 06/07/2020   Procedure: POST PARTUM TUBAL LIGATION;  Surgeon: Will Bonnet, MD;  Location: [facility] ORS;  Service: Gynecology;  Laterality: Bilateral;    Prior to Admission medications   Medication Sig Start Date End Date Taking? Authorizing Provider  Prenatal MV-Min-FA-Omega-3 (PRENATAL GUMMIES/DHA & FA) 0.4-32.5 MG CHEW Chew 1 tablet by mouth daily.   Yes [provider]  magnesium 30 MG tablet Take 30 mg by mouth 2 (two) times daily. Patient not taking: Reported on 07/27/2020    [provider]    Allergies  Allergen Reactions  . Latex Rash, Itching and Swelling     Social History   Socioeconomic History  .  Marital status: Significant Other    Spouse name: Jaci Standard  . Number of children: Not on file  . Years of education: Not on file  . Highest education level: Not on file  Occupational History  . Not on file  Tobacco Use  . Smoking status: Former Smoker    Packs/day: 0.25    Years: 4.00    Pack years: 1.00    Types: Cigarettes  . Smokeless tobacco: Never Used  . Tobacco comment: Quit with 2nd pregnancy  Vaping Use  . Vaping Use: Former  Substance and Sexual Activity  . Alcohol use: No  . Drug use: No  . Sexual activity: Yes    Birth control/protection: Surgical    Comment: BTL  Other Topics Concern  . Not on file  Social History Narrative  . Not on file   Social Determinants of Health   Financial Resource Strain:   . Difficulty of Paying Living Expenses: Not on file  Food Insecurity:   . Worried About Charity fundraiser in the Last Year: Not on file  . Ran Out of Food in the Last Year: Not on file  Transportation Needs:   . Lack of Transportation (Medical): Not on file  . Lack of Transportation (Non-Medical): Not on file  Physical Activity:   . Days of Exercise per Week: Not on file  . Minutes of Exercise per Session: Not on file  Stress:   . Feeling of Stress : Not on file  Social Connections:   .  Frequency of Communication with Friends and Family: Not on file  . Frequency of Social Gatherings with Friends and Family: Not on file  . Attends Religious Services: Not on file  . Active Member of Clubs or Organizations: Not on file  . Attends Archivist Meetings: Not on file  . Marital Status: Not on file  Intimate Partner Violence:   . Fear of Current or Ex-Partner: Not on file  . Emotionally Abused: Not on file  . Physically Abused: Not on file  . Sexually Abused: Not on file    Family History  Problem Relation Age of Onset  . Hemophilia Maternal Uncle        two maternal uncles    Review of Systems  Constitutional: Negative.   HENT: Negative.     Eyes: Negative.   Respiratory: Negative.   Cardiovascular: Negative.   Gastrointestinal: Negative.   Genitourinary: Negative.   Musculoskeletal: Negative.   Skin: Negative.   Neurological: Negative.   Psychiatric/Behavioral: Negative.      Physical Exam BP 124/74   Ht 5\' 7"  (1.702 m)   Wt 217 lb (98.4 kg)   BMI 33.99 kg/m   Physical Exam Constitutional:      General: She is not in acute distress.    Appearance: Normal appearance. She is well-developed.  Genitourinary:     Pelvic exam was performed with patient in the lithotomy position.     Vulva, inguinal canal, urethra, bladder, vagina, uterus, right adnexa and left adnexa normal.     No posterior fourchette tenderness, injury or lesion present.     No cervical friability, lesion, bleeding or polyp.  HENT:     Head: Normocephalic and atraumatic.  Eyes:     General: No scleral icterus.    Conjunctiva/sclera: Conjunctivae normal.  Cardiovascular:     Rate and Rhythm: Normal rate and regular rhythm.     Heart sounds: No murmur heard.  No friction rub. No gallop.   Pulmonary:     Effort: Pulmonary effort is normal. No respiratory distress.     Breath sounds: Normal breath sounds. No wheezing or rales.  Abdominal:     General: Bowel sounds are normal. There is no distension.     Palpations: Abdomen is soft. There is no mass.     Tenderness: There is no abdominal tenderness. There is no guarding or rebound.     Comments: Incision: without erythema, induration, warmth, and tenderness. It is clean, dry, and intact.    Musculoskeletal:        General: Normal range of motion.     Cervical back: Normal range of motion and neck supple.  Neurological:     General: No focal deficit present.     Mental Status: She is alert and oriented to person, place, and time.     Cranial Nerves: No cranial nerve deficit.  Skin:    General: Skin is warm and dry.     Findings: No erythema.  Psychiatric:        Mood and Affect: Mood  normal.        Behavior: Behavior normal.        Judgment: Judgment normal.      Female Chaperone present during breast and/or pelvic exam.  Assessment: 29 y.o. [id number] presenting for 6 week postpartum visit  Plan: Problem List Items Addressed This Visit    None    Visit Diagnoses    Postpartum care and examination    -  Primary  Relevant Orders   Cervicovaginal ancillary only   Screen for STD (sexually transmitted disease)       Relevant Orders   Cervicovaginal ancillary only     1) Contraception: s/p BTL.  2)  Pap - ASCCP guidelines and rational discussed.  Patient opts for routine screening interval  3) Patient underwent screening for postpartum depression with no concerns noted.  4) Follow up 1 year for routine annual exam  Prentice Docker, MD 07/27/2020 3:20 PM

## 2020-07-29 LAB — CERVICOVAGINAL ANCILLARY ONLY
Chlamydia: NEGATIVE
Comment: NEGATIVE
Comment: NORMAL
Neisseria Gonorrhea: NEGATIVE

## 2021-03-16 ENCOUNTER — Ambulatory Visit (INDEPENDENT_AMBULATORY_CARE_PROVIDER_SITE_OTHER): Payer: Medicaid Other | Admitting: Obstetrics and Gynecology

## 2021-03-16 ENCOUNTER — Other Ambulatory Visit: Payer: Self-pay

## 2021-03-16 ENCOUNTER — Encounter: Payer: Self-pay | Admitting: Obstetrics and Gynecology

## 2021-03-16 VITALS — BP 131/80 | Ht 67.0 in | Wt 231.4 lb

## 2021-03-16 DIAGNOSIS — R300 Dysuria: Secondary | ICD-10-CM

## 2021-03-16 DIAGNOSIS — N61 Mastitis without abscess: Secondary | ICD-10-CM

## 2021-03-16 LAB — POCT URINALYSIS DIPSTICK OB
Blood, UA: POSITIVE
Glucose, UA: NEGATIVE
Nitrite, UA: NEGATIVE
POC,PROTEIN,UA: NEGATIVE
Spec Grav, UA: >=1.03 — AB (ref 1.010–1.025)
Urobilinogen, UA: 0.2 E.U./dL
pH, UA: 5 (ref 5.0–8.0)

## 2021-03-16 MED ORDER — CEPHALEXIN 500 MG PO CAPS: 500.0000 mg | ORAL_CAPSULE | Freq: Four times a day (QID) | ORAL | 0 refills | Status: AC

## 2021-03-16 NOTE — Progress Notes (Signed)
Obstetrics & Gynecology Office Visit   Chief Complaint  Patient presents with   concren for mastitis    History of Present Illness: 30 y.o. G43P2002 female who presents for concern for mastitis in her right breast.  Night before last she started having pain and it was hard to the touch. The skin felt hot to the touch. She had chills, but doesn't think she had a fever.  Yesterday, the  breast was still really painful, but still hard to the touch. Today, it is improved but still painful. The redness in her breast is still there, but has gotten better.  She has used warm compresses and has fed from that side as much as possible.     She also has had cramping in her bladder and sudden urge to go.  No real pain with voiding. She denies hematuria. Her urinary symptoms.    Past Medical History:  Diagnosis Date   Asthma    Crohn disease ([place])    pt states not diagnosed   Diverticulitis    Family history of hemophilia    PTS MATERNAL GRANDMOTHER, ALL OTHER WOMEN IN FAMILY ARE CARRIERS   Psoriasis     Past Surgical History:  Procedure Laterality Date   COLONOSCOPY     ESOPHAGOGASTRODUODENOSCOPY     LAPAROSCOPIC OVARIAN CYSTECTOMY N/A 03/29/2015   Procedure: LAPAROSCOPIC RIGHT OVARIAN CYSTECTOMY;  Surgeon: Aletha Halim, MD;  Location: [facility] ORS;  Service: Gynecology;  Laterality: N/A;   TONSILLECTOMY     TUBAL LIGATION Bilateral 06/07/2020   Procedure: POST PARTUM TUBAL LIGATION;  Surgeon: Will Bonnet, MD;  Location: [facility] ORS;  Service: Gynecology;  Laterality: Bilateral;    Gynecologic History: No LMP recorded.  Obstetric History: D7O2423  Family History  Problem Relation Age of Onset   Hemophilia Maternal Uncle        two maternal uncles    Social History   Socioeconomic History   Marital status: Significant Other    Spouse name: Jaci Standard   Number of children: Not on file   Years of education: Not on file   Highest education level: Not on file  Occupational History    Not on file  Tobacco Use   Smoking status: Former    Packs/day: 0.25    Years: 4.00    Pack years: 1.00    Types: Cigarettes   Smokeless tobacco: Never   Tobacco comments:    Quit with 2nd pregnancy  Vaping Use   Vaping Use: Former  Substance and Sexual Activity   Alcohol use: No   Drug use: No   Sexual activity: Yes    Birth control/protection: Surgical    Comment: BTL  Other Topics Concern   Not on file  Social History Narrative   Not on file   Social Determinants of Health   Financial Resource Strain: Not on file  Food Insecurity: Not on file  Transportation Needs: Not on file  Physical Activity: Not on file  Stress: Not on file  Social Connections: Not on file  Intimate Partner Violence: Not on file    Allergies  Allergen Reactions   Latex Rash, Itching and Swelling    Prior to Admission medications   Medication Sig Start Date End Date Taking? Authorizing Provider  magnesium 30 MG tablet Take 30 mg by mouth 2 (two) times daily. Patient not taking: Reported on 07/27/2020    [provider]  Prenatal MV-Min-FA-Omega-3 (PRENATAL GUMMIES/DHA & FA) 0.4-32.5 MG CHEW Chew 1 tablet by mouth  daily.    [provider]    Review of Systems  Constitutional: Negative.   HENT: Negative.    Eyes: Negative.   Respiratory: Negative.    Cardiovascular: Negative.   Gastrointestinal: Negative.   Genitourinary: Negative.   Musculoskeletal: Negative.   Skin: Negative.   Neurological: Negative.   Psychiatric/Behavioral: Negative.      Physical Exam BP 131/80   Ht 5\' 7"  (1.702 m)   Wt 231 lb 6.4 oz (105 kg)   Breastfeeding Yes   BMI 36.24 kg/m  No LMP recorded. Physical Exam Constitutional:      General: She is not in acute distress.    Appearance: Normal appearance.  Genitourinary:  Breasts:    Right: Skin change and tenderness present. No swelling, bleeding, mass, nipple discharge or breast implant.     Left: No swelling, bleeding, mass,  nipple discharge, skin change, tenderness or breast implant.  HENT:     Head: Normocephalic and atraumatic.  Eyes:     General: No scleral icterus.    Conjunctiva/sclera: Conjunctivae normal.  Chest:    Neurological:     General: No focal deficit present.     Mental Status: She is alert and oriented to person, place, and time.     Cranial Nerves: No cranial nerve deficit.  Psychiatric:        Mood and Affect: Mood normal.        Behavior: Behavior normal.        Judgment: Judgment normal.    Female chaperone present for pelvic and breast  portions of the physical exam  Urinalysis: Glucose: negative Bilirubin: trace Ketones: +++ Specific gravity: >=1.030 Blood: positive pH: 5.0 Protein: negative Urobilinogen: 0.2 Nitrite: negative Leukocytes: moderate (2+)  Assessment: 30 y.o. F6C1275 female here for  1. Acute mastitis of right breast   2. Dysuria      Plan: Problem List Items Addressed This Visit   None Visit Diagnoses     Acute mastitis of right breast    -  Primary   Relevant Medications   cephALEXin (KEFLEX) 500 MG capsule   Dysuria       Relevant Orders   POC Urinalysis Dipstick OB (Completed)   Urine Culture      Mastitis: treat with keflex Urinary sx: urine culture  A total of 23 minutes were spent face-to-face with the patient as well as preparation, review, communication, and documentation during this encounter.    Prentice Docker, MD 03/16/2021 3:01 PM

## 2021-03-18 LAB — URINE CULTURE

## 2021-07-28 ENCOUNTER — Ambulatory Visit: Payer: Medicaid Other | Admitting: Obstetrics and Gynecology

## 2021-08-02 ENCOUNTER — Other Ambulatory Visit: Payer: Self-pay

## 2021-08-02 ENCOUNTER — Other Ambulatory Visit (HOSPITAL_COMMUNITY)
Admission: RE | Admit: 2021-08-02 | Discharge: 2021-08-02 | Disposition: A | Discharge status: Home / Self Care | Payer: Medicaid Other | Source: Ambulatory Visit | Attending: Obstetrics and Gynecology | Admitting: Obstetrics and Gynecology

## 2021-08-02 ENCOUNTER — Ambulatory Visit (INDEPENDENT_AMBULATORY_CARE_PROVIDER_SITE_OTHER): Payer: Medicaid Other | Admitting: Obstetrics and Gynecology

## 2021-08-02 ENCOUNTER — Encounter: Payer: Self-pay | Admitting: Obstetrics and Gynecology

## 2021-08-02 VITALS — BP 131/80 | Ht 67.0 in | Wt 239.0 lb

## 2021-08-02 DIAGNOSIS — Z23 Encounter for immunization: Secondary | ICD-10-CM

## 2021-08-02 DIAGNOSIS — Z1339 Encounter for screening examination for other mental health and behavioral disorders: Secondary | ICD-10-CM

## 2021-08-02 DIAGNOSIS — Z113 Encounter for screening for infections with a predominantly sexual mode of transmission: Secondary | ICD-10-CM | POA: Diagnosis not present

## 2021-08-02 DIAGNOSIS — F419 Anxiety disorder, unspecified: Secondary | ICD-10-CM

## 2021-08-02 DIAGNOSIS — Z0000 Encounter for general adult medical examination without abnormal findings: Secondary | ICD-10-CM

## 2021-08-02 DIAGNOSIS — F32A Depression, unspecified: Secondary | ICD-10-CM

## 2021-08-02 DIAGNOSIS — Z01419 Encounter for gynecological examination (general) (routine) without abnormal findings: Secondary | ICD-10-CM

## 2021-08-02 DIAGNOSIS — Z1331 Encounter for screening for depression: Secondary | ICD-10-CM | POA: Diagnosis not present

## 2021-08-02 MED ORDER — HYDROXYZINE HCL 50 MG PO TABS
50.0000 mg | ORAL_TABLET | Freq: Four times a day (QID) | ORAL | 1 refills | Status: AC | PRN
Start: 2021-08-02 — End: ?

## 2021-08-02 NOTE — Patient Instructions (Signed)
Therapists/Counselors/Psychologists  Karen [place], [place], Delfin Gant and Manuela Schwartz  [phone]  [place], [place] [address]   Peggye Ley, [place] [phone] [place], [place] [address]  Lady Deutscher, [place]  [phone]  [address], [address]  [place], [place] [address]   Bettey Mare, [place] [phone] [address] [place], [place] [address]  Arieliz Latino, LMFT  [phone]  [address]  [place], [place] [address]   Miguel Dibble [phone] [address] [place], [place] [address]  Mahek Schlesinger  [phone]  [address]  [place], [place] [address]   Sena Hitch [phone] [address] [place], [place] [address]  Lestine Box, [place]  [phone]  [address]  [place], [place] [address]   Marjie Skiff, [place] [phone] [address] [place], [place] [address]  Einar Gip  [phone]  [address] [place], [place] [address]   [place] [phone] [email or web address]  Sation Konchella  [phone]  [address] [place],  [address]   [place] [phone] [email or web address]

## 2021-08-02 NOTE — Progress Notes (Signed)
Gynecology Annual Exam  PCP: Pcp, No  Chief Complaint  Patient presents with   Annual Exam   History of Present Illness:  Angela Hebert is a 30 y.o. [id number] who LMP was Patient's last menstrual period was 07/17/2021 (exact date)., presents today for her annual examination.  Her menses are 3-4 weeks, lasting a variable amount of time. She states that about 2 weeks of the months she is having a period (though not bleeding during all the days). This is not new for her. A week or two before her menses she has what she describes as something like labor pains. This lasts for 2-3 days.  She states that she started having pain ~3 months ago.   She has been having some anxiety/depression symptoms around some social and life situations.  She is staying at home with her children. She and her fiance are living with relative while they look for a new house.  She has some normal days. Though she has trouble managing how to deal with it.   She is sexually active, she uses BTL for contraception.  Last Pap: 11/2019  Results were: no abnormalities /neg HPV DNA not done due to age Hx of STDs: chlamydia  There is no FH of breast cancer. There is no FH of ovarian cancer. She is still breast feeding.    Tobacco use: former Alcohol use: none Exercise: no  The patient wears seatbelts: yes.   The patient reports that domestic violence in her life is absent.   Past Medical History:  Diagnosis Date   Asthma    Crohn disease ([place])    pt states not diagnosed   Diverticulitis    Family history of hemophilia    PTS MATERNAL GRANDMOTHER, ALL OTHER WOMEN IN FAMILY ARE CARRIERS   Psoriasis     Past Surgical History:  Procedure Laterality Date   COLONOSCOPY     ESOPHAGOGASTRODUODENOSCOPY     LAPAROSCOPIC OVARIAN CYSTECTOMY N/A 03/29/2015   Procedure: LAPAROSCOPIC RIGHT OVARIAN CYSTECTOMY;  Surgeon: Aletha Halim, MD;  Location: [facility];  Service: Gynecology;  Laterality: N/A;   TONSILLECTOMY     TUBAL  LIGATION Bilateral 06/07/2020   Procedure: POST PARTUM TUBAL LIGATION;  Surgeon: Will Bonnet, MD;  Location: [facility];  Service: Gynecology;  Laterality: Bilateral;    Prior to Admission medications   Medication Sig Start Date End Date Taking? Authorizing Provider  magnesium 30 MG tablet Take 30 mg by mouth 2 (two) times daily.   Yes [provider]  Prenatal MV-Min-FA-Omega-3 (PRENATAL GUMMIES/DHA & FA) 0.4-32.5 MG CHEW Chew 1 tablet by mouth daily.   Yes [provider]    Allergies  Allergen Reactions   Latex Rash, Itching and Swelling    Obstetric History: [id number]  Social History   Socioeconomic History   Marital status: Significant Other    Spouse name: Jaci Standard   Number of children: Not on file   Years of education: Not on file   Highest education level: Not on file  Occupational History   Not on file  Tobacco Use   Smoking status: Former    Packs/day: 0.25    Years: 4.00    Pack years: 1.00    Types: Cigarettes   Smokeless tobacco: Never   Tobacco comments:    Quit with 2nd pregnancy  Vaping Use   Vaping Use: Former  Substance and Sexual Activity   Alcohol use: No   Drug use: No   Sexual activity: Yes  Birth control/protection: Surgical    Comment: BTL  Other Topics Concern   Not on file  Social History Narrative   Not on file   Social Determinants of Health   Financial Resource Strain: Not on file  Food Insecurity: Not on file  Transportation Needs: Not on file  Physical Activity: Not on file  Stress: Not on file  Social Connections: Not on file  Intimate Partner Violence: Not on file    Family History  Problem Relation Age of Onset   Hemophilia Maternal Uncle        two maternal uncles    Review of Systems  Constitutional: Negative.   HENT: Negative.    Eyes: Negative.   Respiratory: Negative.    Cardiovascular: Negative.   Gastrointestinal: Negative.   Genitourinary: Negative.   Musculoskeletal: Negative.    Skin: Negative.   Neurological: Negative.   Psychiatric/Behavioral: Negative.      Physical Exam BP 131/80   Ht 5\' 7"  (1.702 m)   Wt 239 lb (108.4 kg)   LMP 07/17/2021 (Exact Date)   BMI 37.43 kg/m    Physical Exam Constitutional:      General: She is not in acute distress.    Appearance: Normal appearance. She is well-developed.  Genitourinary:     Vulva and bladder normal.     Right Labia: No rash, tenderness, lesions, skin changes or Bartholin's cyst.    Left Labia: No tenderness, lesions, skin changes, Bartholin's cyst or rash.    No inguinal adenopathy present in the right or left side.    Pelvic Tanner Score: 5/5.    No vaginal discharge, erythema, tenderness or bleeding.      Right Adnexa: not tender, not full and no mass present.    Left Adnexa: not tender, not full and no mass present.    No cervical motion tenderness, discharge, lesion or polyp.     Uterus is not enlarged or tender.     No uterine mass detected.    Pelvic exam was performed with patient in the lithotomy position.  HENT:     Head: Normocephalic and atraumatic.  Eyes:     General: No scleral icterus.    Conjunctiva/sclera: Conjunctivae normal.  Neck:     Thyroid: No thyromegaly.  Cardiovascular:     Rate and Rhythm: Normal rate and regular rhythm.     Heart sounds: No murmur heard.   No friction rub. No gallop.  Pulmonary:     Effort: Pulmonary effort is normal. No respiratory distress.     Breath sounds: Normal breath sounds. No wheezing or rales.  Abdominal:     General: Bowel sounds are normal. There is no distension.     Palpations: Abdomen is soft. There is no mass.     Tenderness: There is no abdominal tenderness. There is no guarding or rebound.     Hernia: There is no hernia in the left inguinal area or right inguinal area.  Musculoskeletal:        General: No swelling or tenderness. Normal range of motion.     Cervical back: Normal range of motion and neck supple.   Lymphadenopathy:     Cervical: No cervical adenopathy.     Lower Body: No right inguinal adenopathy. No left inguinal adenopathy.  Neurological:     General: No focal deficit present.     Mental Status: She is alert and oriented to person, place, and time.     Cranial Nerves: No cranial nerve deficit.  Skin:    General: Skin is warm and dry.     Findings: No erythema or rash.  Psychiatric:        Mood and Affect: Mood normal.        Behavior: Behavior normal.        Judgment: Judgment normal.    Female chaperone present for pelvic and breast  portions of the physical exam  Results: AUDIT Questionnaire (screen for alcoholism): 0 PHQ-9: 10 (zero on self harm) GAD-7: 14   Assessment: 30 y.o. [id number] female here for routine annual gynecologic examination  Plan: Problem List Items Addressed This Visit   None Visit Diagnoses     Women's annual routine gynecological examination    -  Primary   Relevant Orders   Cervicovaginal ancillary only   Screening for depression       Screening for alcoholism       Screen for STD (sexually transmitted disease)       Relevant Orders   Cervicovaginal ancillary only   Anxiety and depression       Relevant Medications   hydrOXYzine (ATARAX/VISTARIL) 50 MG tablet       Screening: -- Blood pressure screen normal -- Weight screening: overweight: continue to monitor -- Depression screening negative (PHQ-9) -- Nutrition: normal -- cholesterol screening: not due for screening -- osteoporosis screening: not due -- tobacco screening: not using -- alcohol screening: AUDIT questionnaire indicates low-risk usage. -- family history of breast cancer screening: done. not at high risk. -- no evidence of domestic violence or intimate partner violence. -- STD screening: gonorrhea/chlamydia NAAT collected -- pap smear collected per ASCCP guidelines -- flu vaccine  will get today -- HPV vaccination series: received  Prentice Docker,  MD 08/02/2021 3:19 PM

## 2021-08-02 NOTE — Addendum Note (Signed)
Addended by: Cleophas Dunker D on: 08/02/2021 04:15 PM   Modules accepted: Orders

## 2021-08-07 LAB — CERVICOVAGINAL ANCILLARY ONLY
Chlamydia: NEGATIVE
Comment: NEGATIVE
Comment: NEGATIVE
Comment: NORMAL
Neisseria Gonorrhea: NEGATIVE
Trichomonas: NEGATIVE

## 2021-11-14 ENCOUNTER — Other Ambulatory Visit: Payer: Self-pay

## 2021-11-14 ENCOUNTER — Ambulatory Visit
Admission: EM | Admit: 2021-11-14 | Discharge: 2021-11-14 | Disposition: A | Discharge status: Home / Self Care | Payer: Medicaid Other | Attending: Internal Medicine | Admitting: Internal Medicine

## 2021-11-14 DIAGNOSIS — S0501XA Injury of conjunctiva and corneal abrasion without foreign body, right eye, initial encounter: Secondary | ICD-10-CM | POA: Diagnosis not present

## 2021-11-14 MED ORDER — SULFACETAMIDE SODIUM 10 % OP SOLN: 1.0000 [drp] | Freq: Four times a day (QID) | OPHTHALMIC | 0 refills | Status: AC

## 2021-11-14 MED ORDER — KETOROLAC TROMETHAMINE 0.5 % OP SOLN: 1.0000 [drp] | Freq: Four times a day (QID) | OPHTHALMIC | 0 refills | Status: AC

## 2021-11-14 NOTE — Discharge Instructions (Signed)
Symptoms and exam today demonstrate a small right corneal abrasion in the 4:00 position.  Prescriptions for sulfacetamide (antibiotic) and ketorolac (anti inflammatory/pain reliever) eye drops were sent to the pharmacy.  Anticipate gradual improvement over the next few days.  Followup with eye doctor in a few days if not improving as expected, for severe/sustained eye pain, or if change in vision/loss of vision occurs. ?

## 2021-11-14 NOTE — ED Provider Notes (Signed)
?[place] ? ? ? ?CSN: [id number] ?Arrival date & time: 11/14/21  0843 ? ? ?  ? ?History   ?Chief Complaint ?Chief Complaint  ?Patient presents with  ? Eye Problem  ? ? ?HPI ?Angela Hebert is a 31 y.o. female.  She presents today after her toddler scratched her in the right yesterday.  The inner corner of her right eye is quite sore and today she has a little bit of headache with that also.  No loss of visual acuity.  Eye is light sensitive, and today the upper lid is a little puffy. ? ?HPI ? ?Past Medical History:  ?Diagnosis Date  ? Asthma   ? Crohn disease ([place])   ? pt states not diagnosed  ? Diverticulitis   ? Family history of hemophilia   ? PTS MATERNAL GRANDMOTHER, ALL OTHER WOMEN IN FAMILY ARE CARRIERS  ? Psoriasis   ? ? ?Patient Active Problem List  ? Diagnosis Date Noted  ? Encounter for induction of labor 06/07/2020  ? [redacted] weeks gestation of pregnancy 06/07/2020  ? Encounter for sterilization 06/07/2020  ? Asthma without status asthmaticus 01/08/2020  ? Supervision of other normal pregnancy, antepartum 11/19/2019  ? Skin cancer 06/15/2013  ? Psoriasis 05/25/2011  ? Allergic rhinitis 05/25/2011  ? ? ?Past Surgical History:  ?Procedure Laterality Date  ? COLONOSCOPY    ? ESOPHAGOGASTRODUODENOSCOPY    ? LAPAROSCOPIC OVARIAN CYSTECTOMY N/A 03/29/2015  ? Procedure: LAPAROSCOPIC RIGHT OVARIAN CYSTECTOMY;  Surgeon: Aletha Halim, MD;  Location: [facility];  Service: Gynecology;  Laterality: N/A;  ? TONSILLECTOMY    ? TUBAL LIGATION Bilateral 06/07/2020  ? Procedure: POST PARTUM TUBAL LIGATION;  Surgeon: Will Bonnet, MD;  Location: [facility];  Service: Gynecology;  Laterality: Bilateral;  ? ? ?OB History   ? ? Gravida  ?2  ? Para  ?2  ? Term  ?2  ? Preterm  ?   ? AB  ?   ? Living  ?2  ?  ? ? SAB  ?   ? IAB  ?   ? Ectopic  ?   ? Multiple  ?0  ? Live Births  ?2  ?   ?  ?  ? ? ? ?Home Medications   ? ?Prior to Admission medications   ?Medication Sig Start Date End Date Taking? Authorizing Provider   ?ketorolac (ACULAR) 0.5 % ophthalmic solution Place 1 drop into the right eye every 6 (six) hours. 11/14/21  Yes Wynona Luna, MD  ?sulfacetamide (BLEPH-10) 10 % ophthalmic solution Place 1-2 drops into the right eye 4 (four) times daily for 5 days. 11/14/21 11/19/21 Yes Wynona Luna, MD  ?hydrOXYzine (ATARAX/VISTARIL) 50 MG tablet Take 1 tablet (50 mg total) by mouth every 6 (six) hours as needed for itching. 08/02/21   Will Bonnet, MD  ?magnesium 30 MG tablet Take 30 mg by mouth 2 (two) times daily.    [provider]  ?Prenatal MV-Min-FA-Omega-3 (PRENATAL GUMMIES/DHA & FA) 0.4-32.5 MG CHEW Chew 1 tablet by mouth daily.    [provider]  ? ? ?Family History ?Family History  ?Problem Relation Age of Onset  ? Hemophilia Maternal Uncle   ?     two maternal uncles  ? ? ?Social History ?Social History  ? ?Tobacco Use  ? Smoking status: Former  ?  Packs/day: 0.25  ?  Years: 4.00  ?  Pack years: 1.00  ?  Types: Cigarettes  ? Smokeless tobacco: Never  ?  Tobacco comments:  ?  Quit with 2nd pregnancy  ?Vaping Use  ? Vaping Use: Former  ?Substance Use Topics  ? Alcohol use: No  ? Drug use: No  ? ? ? ?Allergies   ?Latex ? ? ?Review of Systems ?Review of Systems see HPI ? ? ?Physical Exam ?Triage Vital Signs ?ED Triage Vitals  ?Enc Vitals Group  ?   BP 11/14/21 0934 116/79  ?   Pulse Rate 11/14/21 0934 74  ?   Resp 11/14/21 0934 18  ?   Temp 11/14/21 0934 99.3 ?F (37.4 ?C)  ?   Temp Source 11/14/21 0934 Oral  ?   SpO2 11/14/21 0934 98 %  ?   Weight 11/14/21 0932 230 lb (104.3 kg)  ?   Height --   ?   Pain Score 11/14/21 0932 7  ?   Pain Loc --   ? ?Updated Vital Signs ?BP 116/79 (BP Location: Left Arm)   Pulse 74   Temp 99.3 ?F (37.4 ?C) (Oral)   Resp 18   Wt 104.3 kg   LMP 10/27/2021 (Approximate)   SpO2 98%   BMI 36.02 kg/m?  ? ?Visual Acuity ?Right Eye Distance: 20/20 Uncorrected ?Left Eye Distance: 20/20 Uncorrected ?Bilateral Distance: 20/20 Uncorrected ? ?Physical  Exam ?Constitutional:   ?   General: She is in acute distress.  ?   Appearance: She is not ill-appearing or toxic-appearing.  ?   Comments: Good hygiene ?  ?HENT:  ?   Mouth/Throat:  ?   Mouth: Mucous membranes are moist.  ?Eyes:  ? ?   Comments: Small lesion 4:00 position with fluorescein uptake ?Mild blepharospasm with puffiness/droopiness of right upper lid.  Diffuse right conj injection  ?Cardiovascular:  ?   Rate and Rhythm: Normal rate.  ?Pulmonary:  ?   Effort: Pulmonary effort is normal. No respiratory distress.  ?Abdominal:  ?   General: There is no distension.  ?Musculoskeletal:  ?   Cervical back: Neck supple.  ?   Comments: Walked into the urgent care independently  ?Skin: ?   General: Skin is warm and dry.  ?   Comments: Pink, no cyanosis  ?Neurological:  ?   Comments: Speech is clear, coherent, logical  ? ? ?UC Treatments / Results  ?Labs ?(all labs ordered are listed, but only abnormal results are displayed) ?Labs Reviewed - No data to display ?NA ? ?EKG ?NA ? ?Radiology ?No results found. ?NA ? ?Procedures ?Procedures (including critical care time) ?NA ? ?Medications Ordered in UC ?Medications - No data to display ?NA ? ?Final Clinical Impressions(s) / UC Diagnoses  ? ?Final diagnoses:  ?Corneal abrasion, right, initial encounter  ? ? ? ?Discharge Instructions   ? ?  ?Symptoms and exam today demonstrate a small right corneal abrasion in the 4:00 position.  Prescriptions for sulfacetamide (antibiotic) and ketorolac (anti inflammatory/pain reliever) eye drops were sent to the pharmacy.  Anticipate gradual improvement over the next few days.  Followup with eye doctor in a few days if not improving as expected, for severe/sustained eye pain, or if change in vision/loss of vision occurs. ? ? ? ? ?ED Prescriptions   ? ? Medication Sig Dispense Auth. Provider  ? sulfacetamide (BLEPH-10) 10 % ophthalmic solution Place 1-2 drops into the right eye 4 (four) times daily for 5 days. 5 mL Wynona Luna, MD   ? ketorolac (ACULAR) 0.5 % ophthalmic solution Place 1 drop into the right eye every 6 (six) hours. 3 mL Valere Dross,  Jadene Pierini, MD  ? ?  ? ?PDMP not reviewed this encounter. ?  ?Wynona Luna, MD ?11/15/21 1220 ? ?

## 2021-11-14 NOTE — ED Triage Notes (Signed)
Patient is here for "scratch from toddler to right eye, now swelling, redness". DOI: [id number]. ?

## 2021-11-16 ENCOUNTER — Telehealth: Payer: Self-pay | Admitting: Emergency Medicine

## 2021-11-16 NOTE — Telephone Encounter (Signed)
Received a call from [facility] stating that the patients medication has been on backorder and there has been a shortage for about 1 month for Sulfacetamide 10% eye drops. The patient requested the pharmacy call us and let us know so that we can change the sulfacetamide 10% eye drops to a different medication. Informed provider Kennyth Lose, [place], of the need for a medication change. Kennyth Lose verbalized understanding.  ?
